# Patient Record
Sex: Female | Born: 1955 | Race: Black or African American | Hispanic: No | Smoking: Never smoker
Health system: Southern US, Community
[De-identification: ages and names within clinical notes are randomized; demographics above are authoritative.]

## PROBLEM LIST (undated history)

## (undated) DIAGNOSIS — G709 Myoneural disorder, unspecified: Secondary | ICD-10-CM

## (undated) DIAGNOSIS — M1990 Unspecified osteoarthritis, unspecified site: Secondary | ICD-10-CM

## (undated) DIAGNOSIS — B351 Tinea unguium: Secondary | ICD-10-CM

## (undated) DIAGNOSIS — M5126 Other intervertebral disc displacement, lumbar region: Secondary | ICD-10-CM

## (undated) DIAGNOSIS — J45909 Unspecified asthma, uncomplicated: Secondary | ICD-10-CM

## (undated) DIAGNOSIS — E785 Hyperlipidemia, unspecified: Secondary | ICD-10-CM

## (undated) DIAGNOSIS — Z973 Presence of spectacles and contact lenses: Secondary | ICD-10-CM

## (undated) DIAGNOSIS — I10 Essential (primary) hypertension: Secondary | ICD-10-CM

## (undated) DIAGNOSIS — N390 Urinary tract infection, site not specified: Secondary | ICD-10-CM

## (undated) DIAGNOSIS — R7303 Prediabetes: Secondary | ICD-10-CM

## (undated) DIAGNOSIS — K219 Gastro-esophageal reflux disease without esophagitis: Secondary | ICD-10-CM

## (undated) DIAGNOSIS — J069 Acute upper respiratory infection, unspecified: Secondary | ICD-10-CM

## (undated) HISTORY — DX: Other intervertebral disc displacement, lumbar region: M51.26

## (undated) HISTORY — DX: Unspecified osteoarthritis, unspecified site: M19.90

## (undated) HISTORY — DX: Urinary tract infection, site not specified: N39.0

## (undated) HISTORY — DX: Essential (primary) hypertension: I10

## (undated) HISTORY — PX: LUMBAR DISC SURGERY: SHX700

## (undated) HISTORY — DX: Presence of spectacles and contact lenses: Z97.3

## (undated) HISTORY — DX: Hyperlipidemia, unspecified: E78.5

## (undated) HISTORY — DX: Gastro-esophageal reflux disease without esophagitis: K21.9

## (undated) HISTORY — PX: COLONOSCOPY: SHX174

## (undated) HISTORY — DX: Tinea unguium: B35.1

## (undated) HISTORY — DX: Unspecified asthma, uncomplicated: J45.909

## (undated) HISTORY — DX: Myoneural disorder, unspecified: G70.9

## (undated) HISTORY — DX: Acute upper respiratory infection, unspecified: J06.9

## (undated) HISTORY — DX: Prediabetes: R73.03

---

## 2004-04-12 ENCOUNTER — Encounter: Admission: RE | Admit: 2004-04-12 | Discharge: 2004-04-12 | Payer: Self-pay | Admitting: Specialist

## 2005-09-12 ENCOUNTER — Ambulatory Visit: Payer: Self-pay | Admitting: Family Medicine

## 2005-09-26 ENCOUNTER — Ambulatory Visit (HOSPITAL_COMMUNITY): Admission: RE | Admit: 2005-09-26 | Discharge: 2005-09-26 | Payer: Self-pay | Admitting: Orthopaedic Surgery

## 2005-09-26 ENCOUNTER — Ambulatory Visit: Payer: Self-pay | Admitting: *Deleted

## 2005-09-30 ENCOUNTER — Encounter: Payer: Self-pay | Admitting: Family Medicine

## 2005-09-30 ENCOUNTER — Ambulatory Visit: Payer: Self-pay | Admitting: Family Medicine

## 2005-10-04 ENCOUNTER — Ambulatory Visit (HOSPITAL_COMMUNITY): Admission: RE | Admit: 2005-10-04 | Discharge: 2005-10-04 | Payer: Self-pay | Admitting: Family Medicine

## 2007-11-22 ENCOUNTER — Emergency Department (HOSPITAL_COMMUNITY): Admission: EM | Admit: 2007-11-22 | Discharge: 2007-11-22 | Payer: Self-pay | Admitting: Emergency Medicine

## 2007-12-18 ENCOUNTER — Encounter: Admission: RE | Admit: 2007-12-18 | Discharge: 2008-03-12 | Payer: Self-pay | Admitting: Internal Medicine

## 2008-01-13 ENCOUNTER — Encounter: Admission: RE | Admit: 2008-01-13 | Discharge: 2008-01-13 | Payer: Self-pay | Admitting: Occupational Medicine

## 2008-02-11 ENCOUNTER — Encounter
Admission: RE | Admit: 2008-02-11 | Discharge: 2008-02-11 | Payer: Self-pay | Admitting: Physical Medicine & Rehabilitation

## 2008-07-02 ENCOUNTER — Inpatient Hospital Stay (HOSPITAL_COMMUNITY): Admission: RE | Admit: 2008-07-02 | Discharge: 2008-07-06 | Payer: Self-pay | Admitting: Orthopedic Surgery

## 2010-09-27 LAB — BASIC METABOLIC PANEL
BUN: 10 mg/dL (ref 6–23)
CO2: 31 mEq/L (ref 19–32)
Calcium: 9.8 mg/dL (ref 8.4–10.5)
Chloride: 104 mEq/L (ref 96–112)
Creatinine, Ser: 0.75 mg/dL (ref 0.4–1.2)
GFR calc Af Amer: >60 mL/min (ref >60)
GFR calc non Af Amer: >60 mL/min (ref >60)
Glucose, Bld: 105 mg/dL — ABNORMAL HIGH (ref 70–99)
Potassium: 4.2 mEq/L (ref 3.5–5.1)
Sodium: 142 mEq/L (ref 135–145)

## 2010-09-27 LAB — HEMOGLOBIN AND HEMATOCRIT, BLOOD
HCT: 37.4 % (ref 36.0–46.0)
Hemoglobin: 12.4 g/dL (ref 12.0–15.0)

## 2010-10-26 NOTE — Op Note (Signed)
NAMEVENETA, SLITER                 ACCOUNT NO.:  [id number]   MEDICAL RECORD NO.:  [id number]          PATIENT TYPE:  AMB   LOCATION:  DAY                          FACILITY:  [facility]   PHYSICIAN:  Alvy Beal, MD    DATE OF BIRTH:  03/11/1956   DATE OF PROCEDURE:  DATE OF DISCHARGE:                               OPERATIVE REPORT   PREOPERATIVE DIAGNOSIS:  Lumbar spinal stenosis L3-4, 4-5.   POSTOPERATIVE DIAGNOSIS:  Lumbar spinal stenosis L3-4, 4-5.   OPERATIVE PROCEDURE:  Lumbar decompression L3-4, 4-5.   COMPLICATIONS:  None.   ESTIMATED BLOOD LOSS:  400 mL.   FIRST ASSISTANT:  Crissie Reese, PA.   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  Clinical and radiographic  analysis confirmed the diagnosis of spinal stenosis.  Attempts at  conservative management consisting of injection therapy and physical  therapy and narcotic medications have failed to alleviate her symptoms.  Because of the progression in her neurogenic claudication symptoms and  her overall pain, she elected to proceed with surgery.  All appropriate  risks, benefits and alternatives to surgery were discussed and consent  was obtained.   OPERATIVE NOTE:  The patient was brought to the operating room and  placed supine on the operating table.  After successful induction of  general anesthesia, endotracheal intubation, TEDs, SCDs and a Foley were  applied.  She was turned prone onto the Florence frame.  The arms were  placed overhead and all bony prominences were well-padded.   The spine was then prepped and draped in a sterile fashion.  Two needles  were placed in the lumbar spine and x-ray was taken to localize for  incision.  Midline incision was then made and sharp dissection was  carried out down to and through the deep fascia.  I then dissected  through the  deep fascia and using electrocautery I resected the  paraspinal muscles to expose the L3-L4 and L5 spinous processes.  Using  a Cobb elevator, I completed the subperiosteal dissection, visualized  the spinous process and lamina and facet complex.  Great care was taken  not to violate the facet complex.  Once I had the L3, L4, L5 spinous  processes and the lamina exposed, I then placed a Penfield-4 underneath  the L3 lamina and took a second x-ray, confirming I was at the  appropriate level.   Once this was confirmed, I then resected the L4 spinous process in its  entirety and the majority of the L3 and L5 spinous process.  I then used  a micro-curette to develop a plane underneath the L4 lamina and I did a  complete L4 laminectomy.  I resected the ligamentum flavum and bone to  expose the underlying thecal sac.  I then dissected up superiorly and  performed a partial L3 laminotomy bilaterally.  At this point I then  went into the lateral gutter, removing the thickened ligamentum flavum  and  bone spur until I could identify the L3 pedicle.  Once I identified  the L3 pedicle, I completed the lateral decompression so that I was at  the medial border of the pedicle.  I then proceeded inferiorly, exposing  the L3 nerve and performing a foraminotomy.  Once I had the L3 pedicle  exposed, I could clearly visualize the L3 nerve as it exited the L3  neural foramen.  Once this was decompressed, I continued inferiorly  decompressing the lateral recess until I identified the L4 pedicle.  Again I used the medial border as my most lateral extent of the  decompression.  I again identified the L4 nerve as it was exiting on the  inferior aspect of the pedicle and decompressed this area and performed  a generous foraminotomy.  At this point I went down until I could  palpate and visualize the L5 pedicle and also in a similar fashion  performed a foraminotomy.  At this point I could take a hockey-stick   (dural elevator) and freely pass it along the lateral gutter and out  each of the neural foramen.  At this point I went to the contralateral  side and performed a similar decompression on the contralateral side.   At this point I irrigated copiously with normal saline.  I placed a  Penfield-4 in the L5 neural foramen and a Penfield-4 in the L3 neural  foramen and took an x-ray to again confirm that I had an adequate  decompression and I had spanned the appropriate levels.  Once this was  confirmed, I irrigated copiously with normal saline.  I then checked one  last time in both lateral recesses and neural foramen bilaterally to  ensure that I had adequate bilateral foraminotomies and decompression  and central and lateral recess decompressions at the L3-4 level and L4-5  level.  At this point I used bipolar electrocautery and FloSeal to  obtain and maintain hemostasis.  I then placed a Hemovac drain and then  closed the deep fascia with interrupted #1 Vicryl sutures, superficial  with 2-0 Vicryl sutures, and a 3-0 Monocryl for the skin.  Steri-Strips,  dry dressing were applied, and the patient was extubated and transferred  to the PACU without incident.  At the end of the case all needle and  sponge counts were correct.  The patient tolerated the procedure well.  No intraoperative complications.      Alvy Beal, MD  Electronically Signed     DDB/MEDQ  D:  07/02/2008  T:  07/02/2008  Job:  [id number]

## 2010-10-29 NOTE — Discharge Summary (Signed)
[name], [name]                 ACCOUNT NO.:  [id number]   MEDICAL RECORD NO.:  [id number]          PATIENT TYPE:  INP   LOCATION:  1606                         FACILITY:  [facility]   PHYSICIAN:  Alvy Beal, MD    DATE OF BIRTH:  1956/01/28   DATE OF ADMISSION:  07/02/2008  DATE OF DISCHARGE:  07/06/2008                               DISCHARGE SUMMARY   ADMISSION DIAGNOSIS:  Lumbar spinal stenosis.   DISCHARGE DIAGNOSIS:  Lumbar spinal stenosis.   PROCEDURE:  This is an L3-4, L4-5 lumbar decompression.   BRIEF HISTORY:  Ms. [name] is a very pleasant 55 year old female who has  had some significant low back and leg pain.  Both MRIs confirmed the  diagnosis of lumbar spinal stenosis and unfortunately despite physical  therapy, injection therapy, and pain management, the patient did not get  any relief.  Therefore, the patient elected to undergo lumbar spinal  stenosis surgery.  Dr. Shon Baton did go over the risks with the patient,  which included infection, bleeding, nerve damage, death, stroke,  paralysis, failure to heal, need for further surgery, ongoing or worse  pain, loss of bowel and/or bladder control.  The patient understood the  risks of the procedure and consented.   HOSPITAL COURSE:  The patient's hospital course was approximately 4 days  in length.  Postoperatively day #1, the patient was doing well.  Her  back pain had improved.  She had 25 mL out of her Hemovac and her back  pain was well controlled.  She began working with Physical Therapy.  Postoperatively day #2, the patient had some mild pain complaints, which  slowed her therapy down.  However, she still continued to make progress.  Postoperatively day #4, the patient was doing very well.  She was  tolerating a regular diet, was ambulating with the use of a walker,  having regular bowel movements.  No leg pain.  Calves remained soft and  nontender.  Vital signs remained stable throughout her visit, and she  was  deemed stable to be discharged home with home health.   DISPOSITION:  The patient is discharged in stable condition to home with  [facility].   DISCHARGE MEDICATIONS:  The patient was discharged on Percocet 5/325, 1-  2 tabs p.o. q.4-6 h. p.r.n. pain.  She was also discharged on Robaxin  500 mg 1 tablet p.o. q.8 h. p.r.n. pain.  She was instructed to continue  her Atenolol/chlorothiazide 50/25.  She can continue to use the [employer]  stool softener and she was instructed to discontinue the use of her  hydrocodone.   DISCHARGE INSTRUCTIONS:  The patient is instructed to follow up with Dr.  Shon Baton in 2 weeks in the office for suture removal.  The patient was  given a pre-printed discharge instruction sheet that went over activity.  The patient is not to lift anything heavier than 6 pounds, which is  approximately a gallon of milk, she is to employ back precautions, no  bending, twisting, stooping, or squatting.  She is to walk as much as  possible.  She is allowed to  shower postoperatively day#3.  She is to keep the incision clean and  dry.  She is to have daily dressing changes everyday for 7 days.  The  patient was instructed to call the office for increased fevers, any  increased pain around the incision site, loss of bowel or bladder  function at [phone].      Crissie Reese, PA      Alvy Beal, MD  Electronically Signed    AC/MEDQ  D:  08/07/2008  T:  08/08/2008  Job:  [id number]

## 2012-12-17 ENCOUNTER — Other Ambulatory Visit: Payer: Self-pay | Admitting: Internal Medicine

## 2012-12-17 DIAGNOSIS — Z1231 Encounter for screening mammogram for malignant neoplasm of breast: Secondary | ICD-10-CM

## 2013-03-08 ENCOUNTER — Other Ambulatory Visit (HOSPITAL_COMMUNITY): Payer: Self-pay | Admitting: Internal Medicine

## 2013-04-09 ENCOUNTER — Other Ambulatory Visit (HOSPITAL_COMMUNITY): Payer: Self-pay | Admitting: Internal Medicine

## 2013-04-09 DIAGNOSIS — Z1231 Encounter for screening mammogram for malignant neoplasm of breast: Secondary | ICD-10-CM

## 2013-04-23 ENCOUNTER — Ambulatory Visit (HOSPITAL_COMMUNITY)
Admission: RE | Admit: 2013-04-23 | Discharge: 2013-04-23 | Disposition: A | Discharge status: Home / Self Care | Payer: Self-pay | Source: Ambulatory Visit | Attending: Internal Medicine | Admitting: Internal Medicine

## 2013-04-23 DIAGNOSIS — Z1231 Encounter for screening mammogram for malignant neoplasm of breast: Secondary | ICD-10-CM

## 2014-04-15 ENCOUNTER — Other Ambulatory Visit (HOSPITAL_COMMUNITY): Payer: Self-pay | Admitting: Internal Medicine

## 2014-04-15 DIAGNOSIS — Z1231 Encounter for screening mammogram for malignant neoplasm of breast: Secondary | ICD-10-CM

## 2014-04-24 ENCOUNTER — Ambulatory Visit (HOSPITAL_COMMUNITY)
Admission: RE | Admit: 2014-04-24 | Discharge: 2014-04-24 | Disposition: A | Discharge status: Home / Self Care | Payer: BC Managed Care – PPO | Source: Ambulatory Visit | Attending: Internal Medicine | Admitting: Internal Medicine

## 2014-04-24 DIAGNOSIS — Z1231 Encounter for screening mammogram for malignant neoplasm of breast: Secondary | ICD-10-CM | POA: Diagnosis not present

## 2014-08-06 ENCOUNTER — Encounter: Payer: Self-pay | Admitting: Internal Medicine

## 2014-08-29 ENCOUNTER — Ambulatory Visit (AMBULATORY_SURGERY_CENTER): Payer: Self-pay | Admitting: *Deleted

## 2014-08-29 VITALS — Ht 60.0 in | Wt 233.0 lb

## 2014-08-29 DIAGNOSIS — Z1211 Encounter for screening for malignant neoplasm of colon: Secondary | ICD-10-CM

## 2014-08-29 MED ORDER — MOVIPREP 100 G PO SOLR: 1.0000 | Freq: Once | ORAL | Status: DC

## 2014-08-29 NOTE — Progress Notes (Signed)
No egg or soy allergy No issues with past surgery No diet pills No home 02 use Pt emmi video to e mail

## 2014-09-12 ENCOUNTER — Encounter: Payer: Self-pay | Admitting: Internal Medicine

## 2014-09-12 ENCOUNTER — Ambulatory Visit (AMBULATORY_SURGERY_CENTER): Payer: BLUE CROSS/BLUE SHIELD | Admitting: Internal Medicine

## 2014-09-12 VITALS — BP 126/75 | HR 58 | Temp 98.4°F | Resp 23 | Ht 60.0 in | Wt 233.0 lb

## 2014-09-12 DIAGNOSIS — Z1211 Encounter for screening for malignant neoplasm of colon: Secondary | ICD-10-CM | POA: Diagnosis not present

## 2014-09-12 MED ORDER — SODIUM CHLORIDE 0.9 % IV SOLN: 500.0000 mL | INTRAVENOUS | Status: DC

## 2014-09-12 NOTE — Patient Instructions (Signed)
YOU HAD AN ENDOSCOPIC PROCEDURE TODAY AT THE [facility]:   Refer to the procedure report that was given to you for any specific questions about what was found during the examination.  If the procedure report does not answer your questions, please call your gastroenterologist to clarify.  If you requested that your care partner not be given the details of your procedure findings, then the procedure report has been included in a sealed envelope for you to review at your convenience later.  YOU SHOULD EXPECT: Some feelings of bloating in the abdomen. Passage of more gas than usual.  Walking can help get rid of the air that was put into your GI tract during the procedure and reduce the bloating. If you had a lower endoscopy (such as a colonoscopy or flexible sigmoidoscopy) you may notice spotting of blood in your stool or on the toilet paper. If you underwent a bowel prep for your procedure, you may not have a normal bowel movement for a few days.  Please Note:  You might notice some irritation and congestion in your nose or some drainage.  This is from the oxygen used during your procedure.  There is no need for concern and it should clear up in a day or so.  SYMPTOMS TO REPORT IMMEDIATELY:   Following lower endoscopy (colonoscopy or flexible sigmoidoscopy):  Excessive amounts of blood in the stool  Significant tenderness or worsening of abdominal pains  Swelling of the abdomen that is new, acute  Fever of 100F or higher    For urgent or emergent issues, a gastroenterologist can be reached at any hour by calling [phone].   DIET: Your first meal following the procedure should be a small meal and then it is ok to progress to your normal diet. Heavy or fried foods are harder to digest and may make you feel nauseous or bloated.  Likewise, meals heavy in dairy and vegetables can increase bloating.  Drink plenty of fluids but you should avoid alcoholic beverages for 24  hours.  ACTIVITY:  You should plan to take it easy for the rest of today and you should NOT DRIVE or use heavy machinery until tomorrow (because of the sedation medicines used during the test).    FOLLOW UP: Our staff will call the number listed on your records the next business day following your procedure to check on you and address any questions or concerns that you may have regarding the information given to you following your procedure. If we do not reach you, we will leave a message.  However, if you are feeling well and you are not experiencing any problems, there is no need to return our call.  We will assume that you have returned to your regular daily activities without incident.  If any biopsies were taken you will be contacted by phone or by letter within the next 1-3 weeks.  Please call us at [phone] if you have not heard about the biopsies in 3 weeks.    SIGNATURES/CONFIDENTIALITY: You and/or your care partner have signed paperwork which will be entered into your electronic medical record.  These signatures attest to the fact that that the information above on your After Visit Summary has been reviewed and is understood.  Full responsibility of the confidentiality of this discharge information lies with you and/or your care-partner.  Return 10 years for colonoscopy.

## 2014-09-12 NOTE — Progress Notes (Signed)
Report to PACU, RN, vss, BBS= Clear.  

## 2014-09-12 NOTE — Op Note (Signed)
[facility] [address] [place], [id number]   COLONOSCOPY PROCEDURE REPORT  PATIENT: Connie MapleJensen, [name]  MR#: [id number] BIRTHDATE: [birth year] , 58  yrs. old GENDER: female ENDOSCOPIST: Hart Carwinora M Ingvald Theisen, MD REFERRED XB:JYNWGBY:Edwin Concepcion ElkAvbuere, M.D. PROCEDURE DATE:  09/12/2014 PROCEDURE:   Colonoscopy, screening First Screening Colonoscopy - Avg.  risk and is 50 yrs.  old or older Yes.  Prior Negative Screening - Now for repeat screening. N/A  History of Adenoma - Now for follow-up colonoscopy & has been > or = to 3 yrs.  N/A ASA CLASS:   Class II INDICATIONS:Screening for colonic neoplasia and Colorectal Neoplasm Risk Assessment for this procedure is average risk. MEDICATIONS: Monitored anesthesia care and Propofol 200 mg IV  DESCRIPTION OF PROCEDURE:   After the risks benefits and alternatives of the procedure were thoroughly explained, informed consent was obtained.  The digital rectal exam revealed no abnormalities of the rectum.   The LB PFC-H190 [id number]  endoscope was introduced through the anus and advanced to the cecum, which was identified by both the appendix and ileocecal valve. No adverse events experienced.   The quality of the prep was fair.  (MoviPrep was used)  The instrument was then slowly withdrawn as the colon was fully examined.      COLON FINDINGS: A normal appearing cecum, ileocecal valve, and appendiceal orifice were identified.  The ascending, transverse, descending, sigmoid colon, and rectum appeared unremarkable. Retroflexed views revealed no abnormalities. The time to cecum = 5.44 Withdrawal time = 6.01   The scope was withdrawn and the procedure completed. COMPLICATIONS: There were no immediate complications.  ENDOSCOPIC IMPRESSION: Normal colonoscopy  RECOMMENDATIONS: High fiber diet Recall colonoscopy in 10 years  eSigned:  Hart Carwinora M Kameshia Madruga, MD 09/12/2014 10:55 AM   cc:

## 2014-09-15 ENCOUNTER — Telehealth: Payer: Self-pay | Admitting: *Deleted

## 2014-09-15 NOTE — Telephone Encounter (Signed)
  Follow up Call-  Call back number 09/12/2014  Post procedure Call Back phone  # [phone]  Permission to leave phone message Yes     Patient questions:  Do you have a fever, pain , or abdominal swelling? No. Pain Score  0 *  Have you tolerated food without any problems? Yes.    Have you been able to return to your normal activities? Yes.    Do you have any questions about your discharge instructions: Diet   No. Medications  No. Follow up visit  No.  Do you have questions or concerns about your Care? No.  Actions: * If pain score is 4 or above: No action needed, pain <4.

## 2015-10-14 ENCOUNTER — Other Ambulatory Visit: Payer: Self-pay | Admitting: Internal Medicine

## 2015-10-14 ENCOUNTER — Other Ambulatory Visit: Payer: Self-pay

## 2015-10-14 DIAGNOSIS — Z1231 Encounter for screening mammogram for malignant neoplasm of breast: Secondary | ICD-10-CM

## 2015-10-14 DIAGNOSIS — E2839 Other primary ovarian failure: Secondary | ICD-10-CM

## 2015-10-30 ENCOUNTER — Ambulatory Visit
Admission: RE | Admit: 2015-10-30 | Discharge: 2015-10-30 | Disposition: A | Discharge status: Home / Self Care | Payer: Medicaid Other | Source: Ambulatory Visit | Attending: Internal Medicine | Admitting: Internal Medicine

## 2015-10-30 ENCOUNTER — Ambulatory Visit
Admission: RE | Admit: 2015-10-30 | Discharge: 2015-10-30 | Disposition: A | Discharge status: Home / Self Care | Payer: Medicaid Other | Source: Ambulatory Visit

## 2015-10-30 DIAGNOSIS — Z1231 Encounter for screening mammogram for malignant neoplasm of breast: Secondary | ICD-10-CM

## 2015-10-30 DIAGNOSIS — E2839 Other primary ovarian failure: Secondary | ICD-10-CM

## 2016-10-05 ENCOUNTER — Other Ambulatory Visit: Payer: Self-pay | Admitting: Internal Medicine

## 2016-10-05 DIAGNOSIS — Z1231 Encounter for screening mammogram for malignant neoplasm of breast: Secondary | ICD-10-CM

## 2016-10-31 ENCOUNTER — Ambulatory Visit: Payer: Medicaid Other

## 2016-12-05 ENCOUNTER — Ambulatory Visit
Admission: RE | Admit: 2016-12-05 | Discharge: 2016-12-05 | Disposition: A | Discharge status: Home / Self Care | Payer: Medicaid Other | Source: Ambulatory Visit | Attending: Internal Medicine | Admitting: Internal Medicine

## 2016-12-05 DIAGNOSIS — Z1231 Encounter for screening mammogram for malignant neoplasm of breast: Secondary | ICD-10-CM

## 2017-01-24 ENCOUNTER — Other Ambulatory Visit: Payer: Self-pay | Admitting: Specialist

## 2017-01-24 DIAGNOSIS — R490 Dysphonia: Secondary | ICD-10-CM

## 2017-01-24 DIAGNOSIS — M5481 Occipital neuralgia: Secondary | ICD-10-CM

## 2017-01-24 DIAGNOSIS — G3184 Mild cognitive impairment, so stated: Secondary | ICD-10-CM

## 2017-01-24 DIAGNOSIS — R251 Tremor, unspecified: Secondary | ICD-10-CM

## 2017-02-05 ENCOUNTER — Other Ambulatory Visit: Payer: Medicaid Other

## 2017-02-10 ENCOUNTER — Other Ambulatory Visit: Payer: Self-pay

## 2017-02-10 DIAGNOSIS — R609 Edema, unspecified: Secondary | ICD-10-CM

## 2017-03-22 ENCOUNTER — Encounter (HOSPITAL_COMMUNITY): Payer: Self-pay

## 2017-03-22 ENCOUNTER — Encounter: Payer: Self-pay | Admitting: Vascular Surgery

## 2017-03-30 ENCOUNTER — Encounter: Payer: Self-pay | Admitting: Vascular Surgery

## 2017-03-30 ENCOUNTER — Ambulatory Visit (HOSPITAL_COMMUNITY)
Admission: RE | Admit: 2017-03-30 | Discharge: 2017-03-30 | Disposition: A | Discharge status: Home / Self Care | Payer: Medicare Other | Source: Ambulatory Visit | Attending: Vascular Surgery | Admitting: Vascular Surgery

## 2017-03-30 ENCOUNTER — Ambulatory Visit (INDEPENDENT_AMBULATORY_CARE_PROVIDER_SITE_OTHER): Payer: Medicare Other | Admitting: Vascular Surgery

## 2017-03-30 VITALS — BP 139/91 | HR 73 | Temp 99.4°F | Resp 16 | Ht 66.0 in | Wt 213.0 lb

## 2017-03-30 DIAGNOSIS — R609 Edema, unspecified: Secondary | ICD-10-CM

## 2017-03-30 DIAGNOSIS — M7989 Other specified soft tissue disorders: Secondary | ICD-10-CM | POA: Diagnosis not present

## 2017-03-30 DIAGNOSIS — I872 Venous insufficiency (chronic) (peripheral): Secondary | ICD-10-CM | POA: Insufficient documentation

## 2017-03-30 NOTE — Progress Notes (Signed)
HISTORY AND PHYSICAL     CC:  Leg pain and swelling bilaterally Requesting Provider:  Fleet Contras, MD  HPI: This is a 61 y.o. female who presents today for evaluation for bilateral leg swelling and pain.  She states that the right is worse than the left.  She denies ever having a DVT that she knows of.  She states that when the swelling is really bad, she does wear compression stockings that were purchased at [employer].  She states that if she sits for a long period of time or if she stands for a long period of time, her sx worsen.  She states that she now will sit for 20 minutes and then stand for 20 minutes. She states that her legs hurt when she walks, but hurts more with prolonged standing.  She states that if she squats down, she has a lot of difficulty getting up and states that her legs are stiff.   She does have a hx of lumbar herniated disc with surgery.  She states that her right leg is worse than her left.  She is on a CCB and ACEI/diuretic for blood pressure control.    Other chronic medical problems include chronic back pain, hypertension, hyperlipidemia which are also currently stable  She moved here from [place] in 2000.  She has never smoked and does not drink alcohol.  Her mother had heart disease before the age of 61.  Past Medical History:  Diagnosis Date  . Arthritis   . Borderline diabetes   . GERD (gastroesophageal reflux disease)   . Hyperlipidemia   . Hypertension   . Lumbar herniated disc   . Neuromuscular disorder (HCC)    Fibromyalgia  . Onychomycosis   . UTI (lower urinary tract infection)   . Wears glasses     Past Surgical History:  Procedure Laterality Date  . LUMBAR DISC SURGERY      Allergies  Allergen Reactions  . Penicillins Nausea And Vomiting    tremors    Current Outpatient Prescriptions  Medication Sig Dispense Refill  . amLODipine (NORVASC) 5 MG tablet Take 5 mg by mouth daily.    . ciprofloxacin (CIPRO) 250 MG tablet Take 250  mg by mouth 2 (two) times daily.    . ergocalciferol (VITAMIN D2) 50000 UNITS capsule Take 50,000 Units by mouth once a week.    . gabapentin (NEURONTIN) 100 MG capsule Take 100 mg by mouth 3 (three) times daily.    Marland Kitchen lisinopril-hydrochlorothiazide (PRINZIDE,ZESTORETIC) 20-25 MG per tablet Take 1 tablet by mouth daily.    . Luliconazole 1 % CREA Apply 1 application topically 2 (two) times daily before a meal.    . OVER THE COUNTER MEDICATION Take 1 capsule by mouth 2 (two) times daily before a meal. Omega XL- for joints    . PRESCRIPTION MEDICATION Apply 1 application topically daily. Jublia 10% to toe fungus daily    . terbinafine (LAMISIL) 250 MG tablet Take 250 mg by mouth daily.     No current facility-administered medications for this visit.     Family History  Problem Relation Age of Onset  . Glaucoma Mother   . Colon cancer Neg Hx     Social History   Social History  . Marital status: Widowed    Spouse name: N/A  . Number of children: N/A  . Years of education: N/A   Occupational History  . Not on file.   Social History Main Topics  . Smoking status: Never  Smoker  . Smokeless tobacco: Never Used  . Alcohol use No     Comment: occ glass of wine  . Drug use: No  . Sexual activity: Not on file   Other Topics Concern  . Not on file   Social History Narrative  . No narrative on file     REVIEW OF SYSTEMS:   [X]  denotes positive finding, [ ]  denotes negative finding Cardiac  Comments:  Chest pain or chest pressure:    Shortness of breath upon exertion: x   Short of breath when lying flat: x   Irregular heart rhythm: x       Vascular    Pain in calf, thigh, or hip brought on by ambulation: x   Pain in feet at night that wakes you up from your sleep:  x   Blood clot in your veins:    Leg swelling:  x       Pulmonary    Oxygen at home:    Productive cough:     Wheezing:         Neurologic    Sudden weakness in arms or legs:  x   Sudden numbness in arms  or legs:  x   Sudden onset of difficulty speaking or slurred speech:    Temporary loss of vision in one eye:     Problems with dizziness:  x       Gastrointestinal    Blood in stool:     Vomited blood:         Genitourinary    Burning when urinating:     Blood in urine:        Psychiatric    Major depression:         Hematologic    Bleeding problems:    Problems with blood clotting too easily:        Skin    Rashes or ulcers: x       Constitutional    Fever or chills: x     PHYSICAL EXAMINATION:  Vitals:   03/30/17 1513  BP: (!) 139/91  Pulse: 73  Resp: 16  Temp: 99.4 F (37.4 C)  SpO2: 100%   Vitals:   03/30/17 1513  Weight: 213 lb (96.6 kg)  Height: 5\' 6"  (1.676 m)   Body mass index is 34.38 kg/m.  General:  WDWN in NAD; vital signs documented above Gait: Not observed HENT: WNL, normocephalic Pulmonary: normal non-labored breathing , without Rales, rhonchi,  wheezing Cardiac: regular HR, without  Murmurs without carotid bruits Abdomen: soft, NT, no masses Skin: without rashes Vascular Exam/Pulses:  Right Left  Radial 2+ (normal) 2+ (normal)  Femoral 2+ (normal) 2+ (normal)  Popliteal Unable to palpate  Unable to palpate   DP 2+ (weak) 2+ (normal)  PT Unable to palpate  Unable to palpate    Extremities:  BLE swelling-non pitting; darkening color changes to feet bilaterally; no wounds present Musculoskeletal: no muscle wasting or atrophy  Neurologic: A&O X 3;  No focal weakness or paresthesias are detected Psychiatric:  The pt has Normal affect.   Non-Invasive Vascular Imaging:   Lower Extremity Venous Duplex 03/30/17: 1.  No evidence of DVT or superficial thrombosis  2.  Venous incompetence of >54700ms noted in the right GSV in the proximal calf and CFV 3.  Venous incompetence of >56000ms noted in the left saphenofemoral junction, proximal thigh and CFV  GSV Measurements: Right:  0.16cm-1.02cm Left:  0.17cm-0.68cm  SSV  Measurements: Right:  0.2cm-0.36cm Left:  0.17cm   Pt meds includes: Statin:  No. Beta Blocker:  No. Aspirin:  No. ACEI:  Yes.   ARB:  No. CCB use:  Yes Other Antiplatelet/Anticoagulant:  No   ASSESSMENT/PLAN:: 61 y.o. female with bilateral leg pain and swelling (right>left)   -pt does have some mild venous reflux in bilateral lower extremities, but is not a candidate for laser ablation.  Dr. Darrick Penna has recommended that she wear compression stockings and continue to exercise and walk daily.  She is given an [employer] Therapy brochure for compression.   -we will see her back as needed   Doreatha Massed, PA-C [facility] [phone]  Clinic MD:  Pt seen and examined with Dr. Darrick Penna  History and exam findings as above. Patient does have some evidence of superficial venous reflux. However her vein diameter is not large enough to consider laser ablation. She also has a component of deep vein reflux as well. She may also have some element of venous hypertension secondary to obesity.  I recommended that she get 20-30 mm knee-high compression stockings for symptomatically relief. She will follow-up with [place] on as-needed basis.  Fabienne Bruns, MD [facility] [place] Office: [phone] Pager: [phone]

## 2017-04-04 DIAGNOSIS — I868 Varicose veins of other specified sites: Secondary | ICD-10-CM

## 2017-11-20 ENCOUNTER — Other Ambulatory Visit: Payer: Self-pay | Admitting: Internal Medicine

## 2017-11-20 DIAGNOSIS — Z1231 Encounter for screening mammogram for malignant neoplasm of breast: Secondary | ICD-10-CM

## 2017-12-11 ENCOUNTER — Other Ambulatory Visit: Payer: Self-pay | Admitting: Internal Medicine

## 2017-12-11 ENCOUNTER — Ambulatory Visit
Admission: RE | Admit: 2017-12-11 | Discharge: 2017-12-11 | Disposition: A | Discharge status: Home / Self Care | Payer: Medicare Other | Source: Ambulatory Visit | Attending: Internal Medicine | Admitting: Internal Medicine

## 2017-12-11 DIAGNOSIS — Z1231 Encounter for screening mammogram for malignant neoplasm of breast: Secondary | ICD-10-CM

## 2017-12-11 DIAGNOSIS — E2839 Other primary ovarian failure: Secondary | ICD-10-CM

## 2017-12-12 ENCOUNTER — Ambulatory Visit
Admission: RE | Admit: 2017-12-12 | Discharge: 2017-12-12 | Disposition: A | Discharge status: Home / Self Care | Payer: Medicare Other | Source: Ambulatory Visit | Attending: Internal Medicine | Admitting: Internal Medicine

## 2017-12-12 DIAGNOSIS — E2839 Other primary ovarian failure: Secondary | ICD-10-CM

## 2018-07-14 ENCOUNTER — Encounter (HOSPITAL_COMMUNITY): Payer: Self-pay

## 2018-07-14 ENCOUNTER — Ambulatory Visit (HOSPITAL_COMMUNITY)
Admission: EM | Admit: 2018-07-14 | Discharge: 2018-07-14 | Disposition: A | Discharge status: Home / Self Care | Payer: Medicare Other | Attending: Family Medicine | Admitting: Family Medicine

## 2018-07-14 ENCOUNTER — Other Ambulatory Visit: Payer: Self-pay

## 2018-07-14 DIAGNOSIS — R6889 Other general symptoms and signs: Secondary | ICD-10-CM | POA: Diagnosis not present

## 2018-07-14 MED ORDER — FLUTICASONE PROPIONATE 50 MCG/ACT NA SUSP: 2.0000 | Freq: Every day | NASAL | 0 refills | Status: AC

## 2018-07-14 MED ORDER — ACETAMINOPHEN 325 MG PO TABS
ORAL_TABLET | ORAL | Status: AC
  Filled 2018-07-14: qty 2

## 2018-07-14 MED ORDER — CETIRIZINE HCL 10 MG PO CHEW: 10.0000 mg | CHEWABLE_TABLET | Freq: Every day | ORAL | 0 refills | Status: DC

## 2018-07-14 MED ORDER — ACETAMINOPHEN 325 MG PO TABS
650.0000 mg | ORAL_TABLET | Freq: Once | ORAL | Status: AC
  Administered 2018-07-14: 650 mg via ORAL

## 2018-07-14 MED ORDER — BENZONATATE 100 MG PO CAPS: 100.0000 mg | ORAL_CAPSULE | Freq: Three times a day (TID) | ORAL | 0 refills | Status: DC

## 2018-07-14 NOTE — ED Triage Notes (Signed)
Pt cc fever , chills, SOB and coughing . X 3 days

## 2018-07-14 NOTE — Discharge Instructions (Signed)
Get plenty of rest and push fluids.  Drink at least half your body weight in ounces.  Supplement with pedialyte or OTC oral rehydration solution as needed.  Tessalon Perles prescribed for cough Zyrtec-D prescribed for nasal congestion, runny nose, and/or sore throat Flonase prescribed for nasal congestion and runny nose Use medications daily for symptom relief Use OTC medications like ibuprofen or tylenol as needed fever or pain Call PCP first thing Monday for further evaluation and management of symptoms and whether to begin taking medications for upcoming trip to [place]. Return or go to ER if you have any new or worsening symptoms fever, chills, nausea, vomiting, chest pain, cough, shortness of breath, wheezing, abdominal pain, changes in bowel or bladder habits, etc..Marland Kitchen

## 2018-07-14 NOTE — ED Provider Notes (Signed)
[facility]   [id number] 07/14/18 Arrival Time: 1008   CC: URI symptoms   SUBJECTIVE: History from: patient.  [name] is a 63 y.o. female who presents with abrupt onset of nasal congestion, runny nose, sore throat, and cough x 3 days.  Denies sick exposure or precipitating event.  States she received immunizations on 08/11/2018 for upcoming trip to [place], and that's when symptoms occurred.  Has NOT tried OTC medications.  Reports previous symptoms in the past.  Complains of associated fever, chills, fatigue, and nausea.  Denies sinus pain, SOB, wheezing, chest pain, nausea, changes in bowel or bladder habits.    Received flu shot this year: yes.  ROS: As per HPI.  Past Medical History:  Diagnosis Date  . Arthritis   . Borderline diabetes   . GERD (gastroesophageal reflux disease)   . Hyperlipidemia   . Hypertension   . Lumbar herniated disc   . Neuromuscular disorder (HCC)    Fibromyalgia  . Onychomycosis   . UTI (lower urinary tract infection)   . Wears glasses    Past Surgical History:  Procedure Laterality Date  . LUMBAR DISC SURGERY     Allergies  Allergen Reactions  . Penicillins Nausea And Vomiting    tremors   No current facility-administered medications on file prior to encounter.    Current Outpatient Medications on File Prior to Encounter  Medication Sig Dispense Refill  . amLODipine (NORVASC) 5 MG tablet Take 5 mg by mouth daily.    . ciprofloxacin (CIPRO) 250 MG tablet Take 250 mg by mouth 2 (two) times daily.    . ergocalciferol (VITAMIN D2) 50000 UNITS capsule Take 50,000 Units by mouth once a week.    . gabapentin (NEURONTIN) 100 MG capsule Take 100 mg by mouth 3 (three) times daily.    Marland Kitchen lisinopril-hydrochlorothiazide (PRINZIDE,ZESTORETIC) 20-25 MG per tablet Take 1 tablet by mouth daily.    . Luliconazole 1 % CREA Apply 1 application topically 2 (two) times daily before a meal.    . OVER THE COUNTER MEDICATION Take 1 capsule by mouth 2  (two) times daily before a meal. Omega XL- for joints    . PRESCRIPTION MEDICATION Apply 1 application topically daily. Jublia 10% to toe fungus daily    . terbinafine (LAMISIL) 250 MG tablet Take 250 mg by mouth daily.     Social History   Socioeconomic History  . Marital status: Widowed    Spouse name: Not on file  . Number of children: Not on file  . Years of education: Not on file  . Highest education level: Not on file  Occupational History  . Not on file  Social Needs  . Financial resource strain: Not on file  . Food insecurity:    Worry: Not on file    Inability: Not on file  . Transportation needs:    Medical: Not on file    Non-medical: Not on file  Tobacco Use  . Smoking status: Never Smoker  . Smokeless tobacco: Never Used  Substance and Sexual Activity  . Alcohol use: No    Alcohol/week: 0.0 standard drinks    Comment: occ glass of wine  . Drug use: No  . Sexual activity: Not on file  Lifestyle  . Physical activity:    Days per week: Not on file    Minutes per session: Not on file  . Stress: Not on file  Relationships  . Social connections:    Talks on phone: Not on  file    Gets together: Not on file    Attends religious service: Not on file    Active member of club or organization: Not on file    Attends meetings of clubs or organizations: Not on file    Relationship status: Not on file  . Intimate partner violence:    Fear of current or ex partner: Not on file    Emotionally abused: Not on file    Physically abused: Not on file    Forced sexual activity: Not on file  Other Topics Concern  . Not on file  Social History Narrative  . Not on file   Family History  Problem Relation Age of Onset  . Glaucoma Mother   . Colon cancer Neg Hx   . Breast cancer Neg Hx     OBJECTIVE:  Vitals:   07/14/18 1037 07/14/18 1039  BP:  140/69  Pulse:  (!) 117  Resp:  18  Temp:  (!) 102.9 F (39.4 C)  TempSrc:  Oral  SpO2:  100%  Weight: 220 lb (99.8  kg)      General appearance: alert; appears fatigued, but nontoxic; speaking in full sentences and tolerating own secretions HEENT: NCAT; Ears: EACs clear, TMs pearly gray; Eyes: PERRL.  EOM grossly intact. Nose: nares patent with mild rhinorrhea, turbinates swollen and erythematous; Throat: oropharynx clear, tonsils non erythematous or enlarged, uvula midline  Neck: supple without LAD Lungs: unlabored respirations, symmetrical air entry; cough: mild; no respiratory distress; CTAB Heart: regular rate and rhythm.  Radial pulses 2+ symmetrical bilaterally Skin: warm and dry Psychological: alert and cooperative; normal mood and affect  ASSESSMENT & PLAN:  1. Flu-like symptoms     Meds ordered this encounter  Medications  . acetaminophen (TYLENOL) tablet 650 mg  . cetirizine (ZYRTEC) 10 MG chewable tablet    Sig: Chew 1 tablet (10 mg total) by mouth daily.    Dispense:  20 tablet    Refill:  0    Order Specific Question:   Supervising Provider    Answer:   Eustace MooreNELSON, YVONNE SUE [[id number]]  . fluticasone (FLONASE) 50 MCG/ACT nasal spray    Sig: Place 2 sprays into both nostrils daily.    Dispense:  16 g    Refill:  0    Order Specific Question:   Supervising Provider    Answer:   Eustace MooreNELSON, YVONNE SUE [[id number]]  . benzonatate (TESSALON) 100 MG capsule    Sig: Take 1 capsule (100 mg total) by mouth every 8 (eight) hours.    Dispense:  21 capsule    Refill:  0    Order Specific Question:   Supervising Provider    Answer:   Eustace MooreELSON, YVONNE SUE [[id number]]    Get plenty of rest and push fluids.  Drink at least half your body weight in ounces.  Supplement with pedialyte or OTC oral rehydration solution as needed.  Tessalon Perles prescribed for cough Zyrtec-D prescribed for nasal congestion, runny nose, and/or sore throat Flonase prescribed for nasal congestion and runny nose Use medications daily for symptom relief Use OTC medications like ibuprofen or tylenol as needed fever or pain Call  PCP first thing Monday for further evaluation and management of symptoms and whether to begin taking medications for upcoming trip to [place]. Return or go to ER if you have any new or worsening symptoms fever, chills, nausea, vomiting, chest pain, cough, shortness of breath, wheezing, abdominal pain, changes in bowel or bladder habits, etc...  Reviewed  expectations re: course of current medical issues. Questions answered. Outlined signs and symptoms indicating need for more acute intervention. Patient verbalized understanding. After Visit Summary given.         Rennis HardingWurst, Jabreel Chimento, PA-C 07/14/18 1425

## 2019-02-13 ENCOUNTER — Other Ambulatory Visit: Payer: Self-pay | Admitting: Internal Medicine

## 2019-02-13 DIAGNOSIS — Z1231 Encounter for screening mammogram for malignant neoplasm of breast: Secondary | ICD-10-CM

## 2019-03-29 ENCOUNTER — Other Ambulatory Visit: Payer: Self-pay

## 2019-03-29 ENCOUNTER — Ambulatory Visit
Admission: RE | Admit: 2019-03-29 | Discharge: 2019-03-29 | Disposition: A | Discharge status: Home / Self Care | Payer: Medicare Other | Source: Ambulatory Visit | Attending: Internal Medicine | Admitting: Internal Medicine

## 2019-03-29 DIAGNOSIS — Z1231 Encounter for screening mammogram for malignant neoplasm of breast: Secondary | ICD-10-CM

## 2019-06-14 HISTORY — PX: BILATERAL CARPAL TUNNEL RELEASE: SHX6508

## 2020-02-21 ENCOUNTER — Other Ambulatory Visit: Payer: Self-pay | Admitting: Internal Medicine

## 2020-02-21 DIAGNOSIS — Z1231 Encounter for screening mammogram for malignant neoplasm of breast: Secondary | ICD-10-CM

## 2020-03-30 ENCOUNTER — Ambulatory Visit
Admission: RE | Admit: 2020-03-30 | Discharge: 2020-03-30 | Disposition: A | Discharge status: Home / Self Care | Payer: Medicare Other | Source: Ambulatory Visit | Attending: Internal Medicine | Admitting: Internal Medicine

## 2020-03-30 ENCOUNTER — Other Ambulatory Visit: Payer: Self-pay

## 2020-03-30 DIAGNOSIS — Z1231 Encounter for screening mammogram for malignant neoplasm of breast: Secondary | ICD-10-CM

## 2020-06-30 ENCOUNTER — Other Ambulatory Visit: Payer: Self-pay | Admitting: Internal Medicine

## 2020-06-30 DIAGNOSIS — E2839 Other primary ovarian failure: Secondary | ICD-10-CM

## 2020-10-20 ENCOUNTER — Other Ambulatory Visit: Payer: Medicare Other

## 2020-10-21 ENCOUNTER — Ambulatory Visit
Admission: RE | Admit: 2020-10-21 | Discharge: 2020-10-21 | Disposition: A | Discharge status: Home / Self Care | Payer: Medicare Other | Source: Ambulatory Visit | Attending: Internal Medicine | Admitting: Internal Medicine

## 2020-10-21 ENCOUNTER — Other Ambulatory Visit: Payer: Self-pay

## 2020-10-21 DIAGNOSIS — E2839 Other primary ovarian failure: Secondary | ICD-10-CM

## 2020-10-24 IMAGING — MG DIGITAL SCREENING BILAT W/ TOMO W/ CAD
6 of 10 series · 6 of 30 positions shown · non-contrast
Comparison: Previous exam(s).

CLINICAL DATA: Screening.

EXAM:
DIGITAL SCREENING BILATERAL MAMMOGRAM WITH TOMO AND CAD

[R CC synth-2D (1 of 2)]
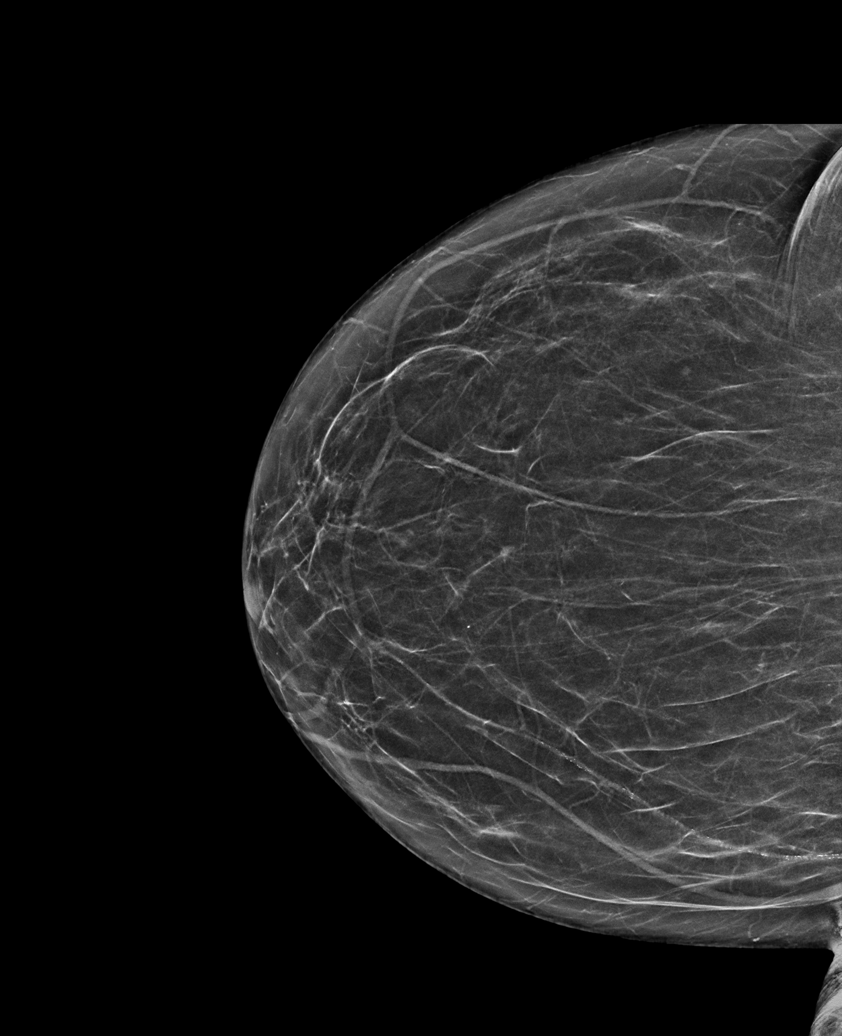

[L CC synth-2D]
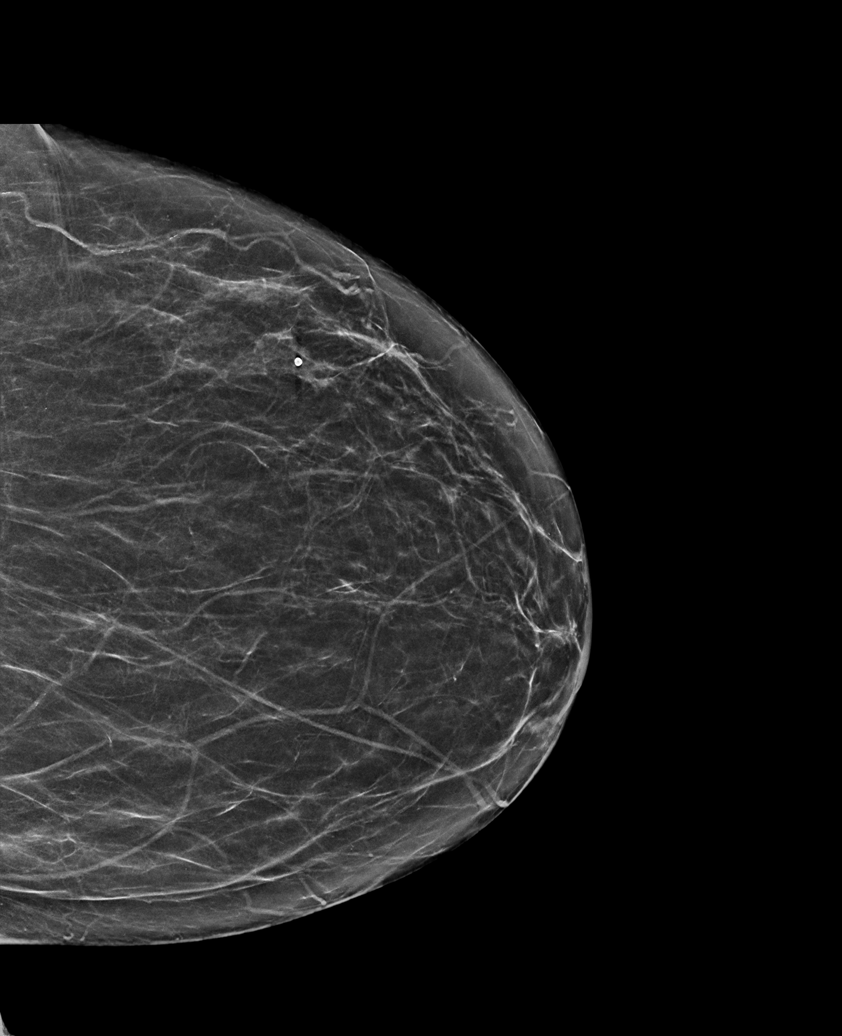

[R CC synth-2D (2 of 2)]
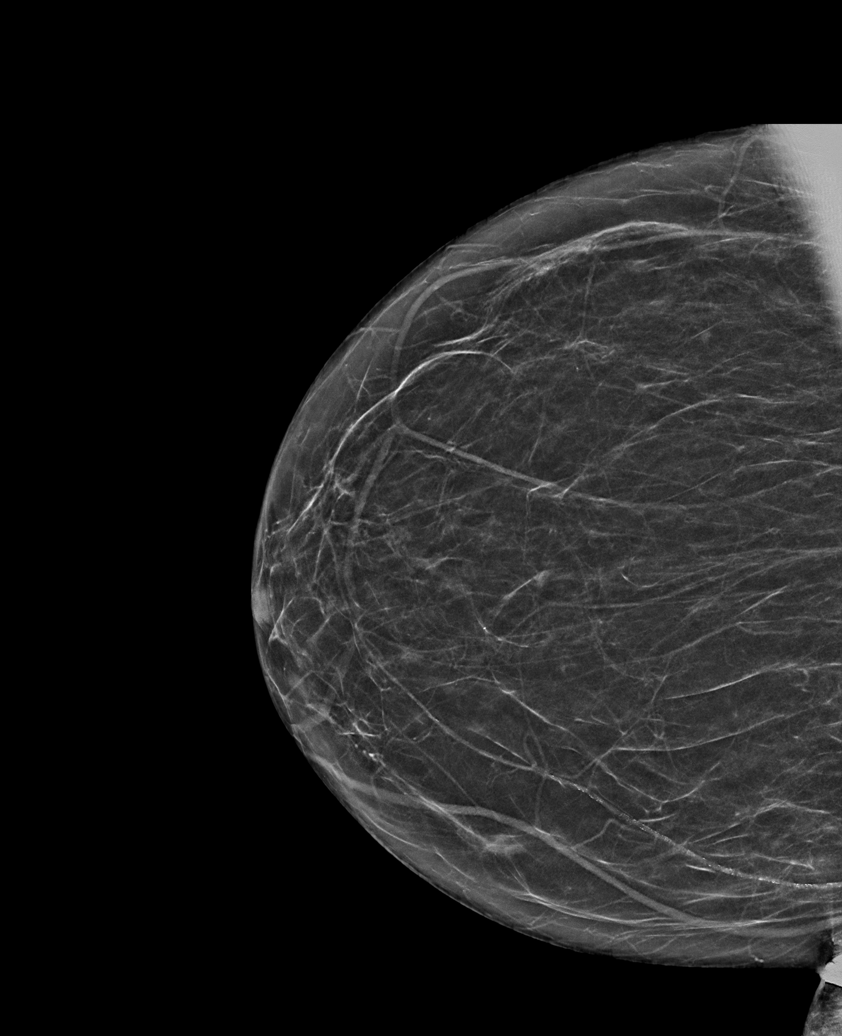

[R MLO synth-2D]
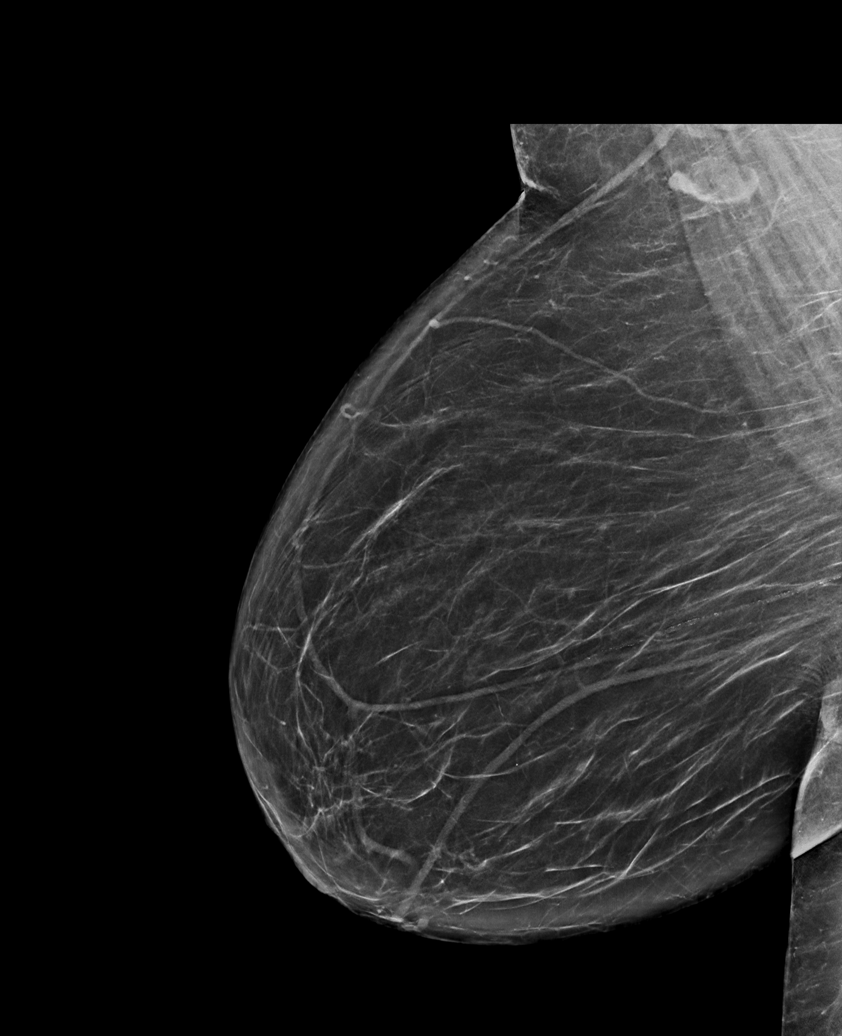

[L MLO synth-2D]
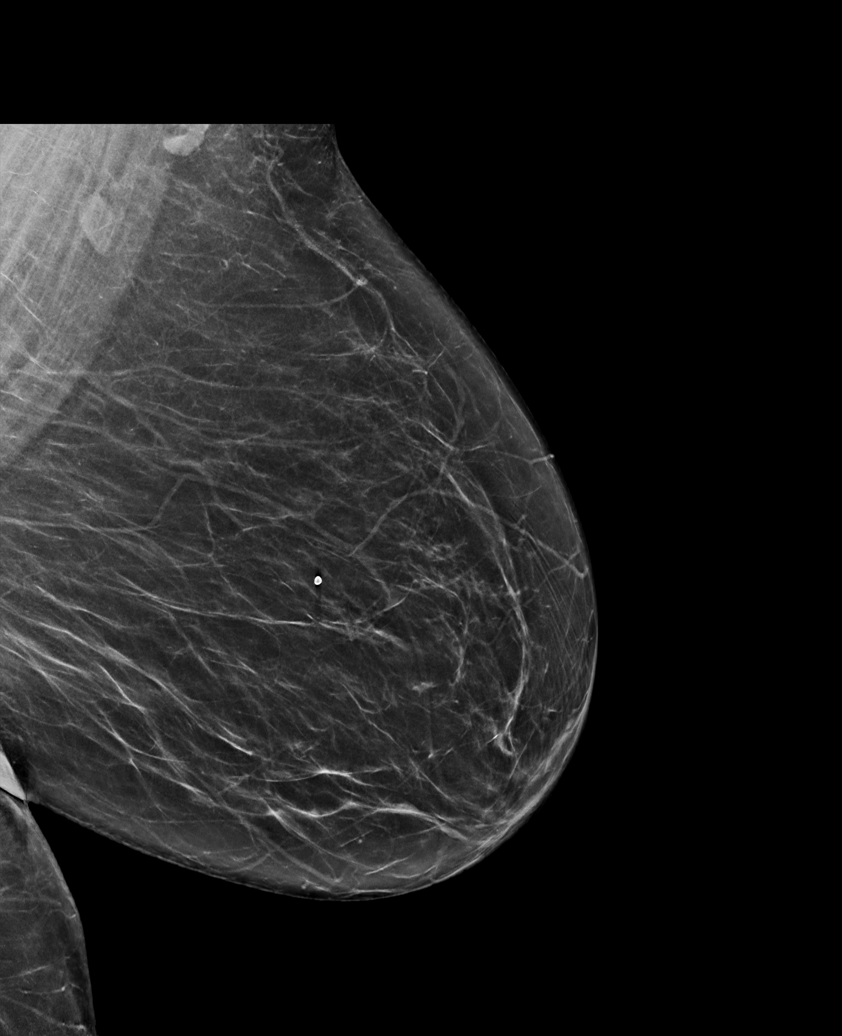

[R MLO tomo · tomo slice 36/71.0]
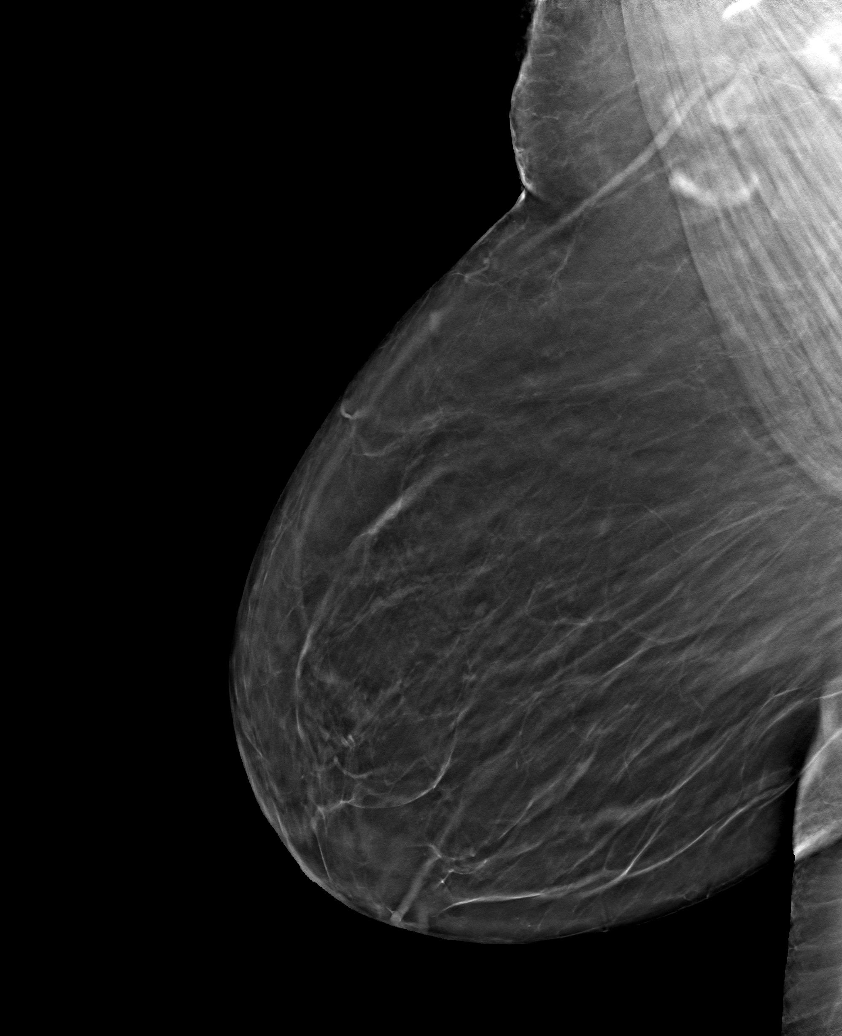

[6 of 30 positions shown; findings below may reference images not displayed]

ACR Breast Density Category b: There are scattered areas of
fibroglandular density.
FINDINGS: There are no findings suspicious for malignancy. Images were
processed with CAD.
IMPRESSION: No mammographic evidence of malignancy. A result letter of this
screening mammogram will be mailed directly to the patient.

RECOMMENDATION:
Screening mammogram in one year. (Code:[id number])

BI-RADS CATEGORY  1: Negative.

## 2021-02-24 ENCOUNTER — Other Ambulatory Visit: Payer: Self-pay | Admitting: Internal Medicine

## 2021-02-24 DIAGNOSIS — Z1231 Encounter for screening mammogram for malignant neoplasm of breast: Secondary | ICD-10-CM

## 2021-03-31 ENCOUNTER — Ambulatory Visit
Admission: RE | Admit: 2021-03-31 | Discharge: 2021-03-31 | Disposition: A | Discharge status: Home / Self Care | Payer: Medicare Other | Source: Ambulatory Visit | Attending: Internal Medicine | Admitting: Internal Medicine

## 2021-03-31 ENCOUNTER — Other Ambulatory Visit: Payer: Self-pay

## 2021-03-31 DIAGNOSIS — Z1231 Encounter for screening mammogram for malignant neoplasm of breast: Secondary | ICD-10-CM

## 2021-08-09 ENCOUNTER — Other Ambulatory Visit: Payer: Self-pay | Admitting: Internal Medicine

## 2021-08-09 DIAGNOSIS — E2839 Other primary ovarian failure: Secondary | ICD-10-CM

## 2021-12-22 ENCOUNTER — Other Ambulatory Visit: Payer: Self-pay | Admitting: Internal Medicine

## 2021-12-23 LAB — COMPLETE METABOLIC PANEL WITH GFR
AG Ratio: 1.7 (calc) (ref 1.0–2.5)
ALT: 22 U/L (ref 6–29)
AST: 17 U/L (ref 10–35)
Albumin: 3.9 g/dL (ref 3.6–5.1)
Alkaline phosphatase (APISO): 67 U/L (ref 37–153)
BUN: 22 mg/dL (ref 7–25)
CO2: 26 mmol/L (ref 20–32)
Calcium: 9.2 mg/dL (ref 8.6–10.4)
Chloride: 103 mmol/L (ref 98–110)
Creat: 0.94 mg/dL (ref 0.50–1.05)
Globulin: 2.3 g/dL (calc) (ref 1.9–3.7)
Glucose, Bld: 78 mg/dL (ref 65–99)
Potassium: 4.4 mmol/L (ref 3.5–5.3)
Sodium: 137 mmol/L (ref 135–146)
Total Bilirubin: 0.5 mg/dL (ref 0.2–1.2)
Total Protein: 6.2 g/dL (ref 6.1–8.1)
eGFR: 68 mL/min/{1.73_m2} (ref >=60)

## 2021-12-23 LAB — LIPID PANEL
Cholesterol: 193 mg/dL (ref <200)
HDL: 86 mg/dL (ref >=50)
LDL Cholesterol (Calc): 93 mg/dL (calc)
Non-HDL Cholesterol (Calc): 107 mg/dL (calc) (ref <130)
Total CHOL/HDL Ratio: 2.2 (calc) (ref <5.0)
Triglycerides: 57 mg/dL (ref <150)

## 2021-12-23 LAB — EXTRA LAV TOP TUBE

## 2022-02-28 ENCOUNTER — Other Ambulatory Visit: Payer: Self-pay | Admitting: Internal Medicine

## 2022-02-28 DIAGNOSIS — Z1231 Encounter for screening mammogram for malignant neoplasm of breast: Secondary | ICD-10-CM

## 2022-03-09 ENCOUNTER — Other Ambulatory Visit: Payer: Self-pay

## 2022-03-09 ENCOUNTER — Emergency Department (HOSPITAL_COMMUNITY): Payer: Medicare Other

## 2022-03-09 ENCOUNTER — Emergency Department (HOSPITAL_COMMUNITY)
Admission: EM | Admit: 2022-03-09 | Discharge: 2022-03-09 | Disposition: A | Discharge status: Home / Self Care | Payer: Medicare Other | Attending: Emergency Medicine | Admitting: Emergency Medicine

## 2022-03-09 ENCOUNTER — Encounter (HOSPITAL_COMMUNITY): Payer: Self-pay | Admitting: Emergency Medicine

## 2022-03-09 DIAGNOSIS — W010XXA Fall on same level from slipping, tripping and stumbling without subsequent striking against object, initial encounter: Secondary | ICD-10-CM | POA: Insufficient documentation

## 2022-03-09 DIAGNOSIS — S20212A Contusion of left front wall of thorax, initial encounter: Secondary | ICD-10-CM | POA: Diagnosis not present

## 2022-03-09 DIAGNOSIS — S20302A Unspecified superficial injuries of left front wall of thorax, initial encounter: Secondary | ICD-10-CM | POA: Diagnosis present

## 2022-03-09 MED ORDER — ONDANSETRON 4 MG PO TBDP
4.0000 mg | ORAL_TABLET | Freq: Once | ORAL | Status: AC
  Administered 2022-03-09: 4 mg via ORAL
  Filled 2022-03-09: qty 1

## 2022-03-09 MED ORDER — LIDOCAINE 5 % EX PTCH: 1.0000 | MEDICATED_PATCH | CUTANEOUS | 0 refills | Status: AC

## 2022-03-09 MED ORDER — ACETAMINOPHEN 325 MG PO TABS
650.0000 mg | ORAL_TABLET | Freq: Once | ORAL | Status: AC
  Administered 2022-03-09: 650 mg via ORAL
  Filled 2022-03-09: qty 2

## 2022-03-09 MED ORDER — IBUPROFEN 400 MG PO TABS
600.0000 mg | ORAL_TABLET | Freq: Once | ORAL | Status: AC
  Administered 2022-03-09: 600 mg via ORAL
  Filled 2022-03-09: qty 1

## 2022-03-09 MED ORDER — OXYCODONE-ACETAMINOPHEN 5-325 MG PO TABS
1.0000 | ORAL_TABLET | Freq: Once | ORAL | Status: AC
  Administered 2022-03-09: 1 via ORAL
  Filled 2022-03-09: qty 1

## 2022-03-09 MED ORDER — LIDOCAINE 5 % EX PTCH
1.0000 | MEDICATED_PATCH | Freq: Once | CUTANEOUS | Status: DC
  Administered 2022-03-09: 1 via TRANSDERMAL
  Filled 2022-03-09: qty 1

## 2022-03-09 MED ORDER — OXYCODONE-ACETAMINOPHEN 5-325 MG PO TABS: 1.0000 | ORAL_TABLET | Freq: Four times a day (QID) | ORAL | 0 refills | Status: DC | PRN

## 2022-03-09 NOTE — ED Provider Notes (Signed)
[facility] EMERGENCY DEPARTMENT Provider Note   CSN: [id number] Arrival date & time: 03/09/22  1933     History  Chief Complaint  Patient presents with   Lytle Michaels    [name] is a 66 y.o. female.  HPI 66 year old female presents after a fall and left rib injury.  She was visiting her husband in the hospital when she tripped leaving the room and landed on her left chest on the ground.  Did not hit her head or lose consciousness.  She is having severe pain.  She was given Percocet in the triage area and states it has improved her pain.  There is no dyspnea though it is painful to take a breath in.  Home Medications Prior to Admission medications   Medication Sig Start Date End Date Taking? Authorizing Provider  lidocaine (LIDODERM) 5 % Place 1 patch onto the skin daily. Remove & Discard patch within 12 hours or as directed by MD 03/09/22  Yes Sherwood Gambler, MD  oxyCODONE-acetaminophen (PERCOCET/ROXICET) 5-325 MG tablet Take 1 tablet by mouth every 6 (six) hours as needed for severe pain. 03/09/22  Yes Sherwood Gambler, MD  amLODipine (NORVASC) 5 MG tablet Take 5 mg by mouth daily.    [provider]  benzonatate (TESSALON) 100 MG capsule Take 1 capsule (100 mg total) by mouth every 8 (eight) hours. 07/14/18   Wurst, [place], PA-C  cetirizine (ZYRTEC) 10 MG chewable tablet Chew 1 tablet (10 mg total) by mouth daily. 07/14/18   Wurst, [place], PA-C  ciprofloxacin (CIPRO) 250 MG tablet Take 250 mg by mouth 2 (two) times daily.    [provider]  ergocalciferol (VITAMIN D2) 50000 UNITS capsule Take 50,000 Units by mouth once a week.    [provider]  fluticasone (FLONASE) 50 MCG/ACT nasal spray Place 2 sprays into both nostrils daily. 07/14/18   Wurst, [place], PA-C  gabapentin (NEURONTIN) 100 MG capsule Take 100 mg by mouth 3 (three) times daily.    [provider]  lisinopril-hydrochlorothiazide (PRINZIDE,ZESTORETIC) 20-25 MG per tablet  Take 1 tablet by mouth daily.    [provider]  Luliconazole 1 % CREA Apply 1 application topically 2 (two) times daily before a meal.    [provider]  OVER THE COUNTER MEDICATION Take 1 capsule by mouth 2 (two) times daily before a meal. Omega XL- for joints    [provider]  PRESCRIPTION MEDICATION Apply 1 application topically daily. Jublia 10% to toe fungus daily    [provider]  terbinafine (LAMISIL) 250 MG tablet Take 250 mg by mouth daily.    [provider]      Allergies    Penicillins    Review of Systems   Review of Systems  Respiratory:  Negative for shortness of breath.   Cardiovascular:  Positive for chest pain.  Gastrointestinal:  Negative for abdominal pain.  Neurological:  Negative for headaches.    Physical Exam Updated Vital Signs BP 136/79 (BP Location: Right Arm)   Pulse 95   Temp 98.1 F (36.7 C) (Oral)   Resp 18   Wt 92.5 kg   SpO2 100%   BMI 32.93 kg/m  Physical Exam Vitals and nursing note reviewed.  Constitutional:      Appearance: She is well-developed.  HENT:     Head: Normocephalic and atraumatic.  Cardiovascular:     Rate and Rhythm: Normal rate and regular rhythm.     Heart sounds: Normal heart sounds.  Pulmonary:     Effort: Pulmonary effort is normal.     Breath sounds: Normal breath sounds.  Chest:     Chest wall: Tenderness present.    Abdominal:     General: There is no distension.     Palpations: Abdomen is soft.     Tenderness: There is no abdominal tenderness.  Skin:    General: Skin is warm and dry.  Neurological:     Mental Status: She is alert.     ED Results / Procedures / Treatments   Labs (all labs ordered are listed, but only abnormal results are displayed) Labs Reviewed - No data to display  EKG None  Radiology DG Ribs Unilateral W/Chest Left  Result Date: 03/09/2022 CLINICAL DATA:  Rib injury. EXAM: LEFT RIBS AND CHEST - 3+ VIEW COMPARISON:  Chest  x-ray 04/12/2004 FINDINGS: No fracture or other bone lesions are seen involving the ribs. There is no evidence of pneumothorax or pleural effusion. Both lungs are clear. Heart size and mediastinal contours are within normal limits. IMPRESSION: Negative. Electronically Signed   By: Darliss Cheney M.D.   On: 03/09/2022 20:57    Procedures Procedures    Medications Ordered in ED Medications  lidocaine (LIDODERM) 5 % 1 patch (has no administration in time range)  ibuprofen (ADVIL) tablet 600 mg (has no administration in time range)  acetaminophen (TYLENOL) tablet 650 mg (has no administration in time range)  oxyCODONE-acetaminophen (PERCOCET/ROXICET) 5-325 MG per tablet 1 tablet (1 tablet Oral Given 03/09/22 2032)  ondansetron (ZOFRAN-ODT) disintegrating tablet 4 mg (4 mg Oral Given 03/09/22 2032)    ED Course/ Medical Decision Making/ A&P                           Medical Decision Making Amount and/or Complexity of Data Reviewed Radiology: independent interpretation performed.    Details: No obvious rib fracture. No pneumothorax.   Risk OTC drugs. Prescription drug management.   Patient presents with significant pain after a fall.  Likely has occult rib fractures based on degree of pain.  No obvious pneumothorax.  Oxygen saturation is 100%.  I do not think CT is emergently warranted.  She was given Percocet in triage and feels better.  We will also give ibuprofen, Tylenol, Lidoderm.  Will prescribe short course of oxycodone and given incentive spirometer.  No other trauma.  No abdominal pain.  Appears stable for discharge and she is eager to be discharged to go back up to be with her husband who is in the hospital.  She was given return precautions.        Final Clinical Impression(s) / ED Diagnoses Final diagnoses:  Rib contusion, left, initial encounter    Rx / DC Orders ED Discharge Orders          Ordered    oxyCODONE-acetaminophen (PERCOCET/ROXICET) 5-325 MG tablet  Every  6 hours PRN        03/09/22 2158    lidocaine (LIDODERM) 5 %  Every 24 hours        03/09/22 2158              Pricilla Loveless, MD 03/09/22 2221

## 2022-03-09 NOTE — ED Provider Triage Note (Signed)
Emergency Medicine Provider Triage Evaluation Note  [name] , a 66 y.o. female  was evaluated in triage.  Pt complains of left-sided rib injury.  Patient had a mechanical fall today fell onto her left rib cage.  Complaining of severe pain with deep breathing on the left.  She did not hit her head or lose consciousness..  Review of Systems  Positive: Rib pain Negative: Loss of consciousness  Physical Exam  BP 136/79 (BP Location: Right Arm)   Pulse 95   Temp 98.1 F (36.7 C) (Oral)   Resp 18   Wt 92.5 kg   SpO2 100%   BMI 32.93 kg/m  Gen:   Awake, no distress   Resp:  Guarded effort  MSK:   Moves extremities without difficulty  Other:  Guarded breathing, exquisitely tender to palpation over the left rib cage  Medical Decision Making  Medically screening exam initiated at 7:54 PM.  Appropriate orders placed.  Jennika Ken was informed that the remainder of the evaluation will be completed by another provider, this initial triage assessment does not replace that evaluation, and the importance of remaining in the ED until their evaluation is complete.  Work-up initiated   Margarita Mail, PA-C 03/09/22 1957

## 2022-03-09 NOTE — Discharge Instructions (Signed)
You either bruised or broke 1 or more of your ribs on the left side.  This will take weeks to heal though the worst of it should be over the first couple days.  You are being given stronger pain medicine to help with this.  Do not drive or operate heavy machinery with this.  You may also take Tylenol and ibuprofen.  You are being given numbing medicine as well.  If at any point you feel like you are having trouble breathing or your pain worsens or does not improve or you develop any other new/concerning symptoms then return to the ER for evaluation.

## 2022-03-09 NOTE — ED Triage Notes (Signed)
Mechanical fall while visiting the hospital, injury to L ribs, no other areas injured. Bruising noted to L ribs, pain with deep breath. Denies blood thinners. Denies head injury or LOC H/o DM

## 2022-03-09 NOTE — ED Notes (Signed)
All discharge instructions reviewed with patient including follow up care and prescriptions. Patient verbalized understanding of same and had no other questions. Patient stable and ambulatory at time of discharge.  

## 2022-04-01 ENCOUNTER — Ambulatory Visit: Payer: Medicare Other

## 2022-04-07 ENCOUNTER — Ambulatory Visit: Payer: Medicare Other | Admitting: Obstetrics and Gynecology

## 2022-04-29 ENCOUNTER — Ambulatory Visit
Admission: RE | Admit: 2022-04-29 | Discharge: 2022-04-29 | Disposition: A | Discharge status: Home / Self Care | Payer: Medicare Other | Source: Ambulatory Visit | Attending: Internal Medicine | Admitting: Internal Medicine

## 2022-04-29 DIAGNOSIS — Z1231 Encounter for screening mammogram for malignant neoplasm of breast: Secondary | ICD-10-CM

## 2022-05-10 ENCOUNTER — Ambulatory Visit (INDEPENDENT_AMBULATORY_CARE_PROVIDER_SITE_OTHER): Payer: Medicare Other | Admitting: Family Medicine

## 2022-05-10 ENCOUNTER — Other Ambulatory Visit (HOSPITAL_COMMUNITY)
Admission: RE | Admit: 2022-05-10 | Discharge: 2022-05-10 | Disposition: A | Discharge status: Home / Self Care | Payer: Medicare Other | Source: Ambulatory Visit | Attending: Obstetrics and Gynecology | Admitting: Obstetrics and Gynecology

## 2022-05-10 ENCOUNTER — Encounter: Payer: Self-pay | Admitting: Family Medicine

## 2022-05-10 VITALS — BP 118/76 | HR 88 | Ht 66.0 in | Wt 195.7 lb

## 2022-05-10 DIAGNOSIS — Z1151 Encounter for screening for human papillomavirus (HPV): Secondary | ICD-10-CM | POA: Diagnosis not present

## 2022-05-10 DIAGNOSIS — Z01419 Encounter for gynecological examination (general) (routine) without abnormal findings: Secondary | ICD-10-CM | POA: Insufficient documentation

## 2022-05-10 DIAGNOSIS — Z124 Encounter for screening for malignant neoplasm of cervix: Secondary | ICD-10-CM | POA: Diagnosis not present

## 2022-05-10 DIAGNOSIS — Z0000 Encounter for general adult medical examination without abnormal findings: Secondary | ICD-10-CM | POA: Diagnosis not present

## 2022-05-10 NOTE — Progress Notes (Addendum)
GYNECOLOGY OFFICE VISIT NOTE  History:   [name] is a 66 y.o. G2P0 here today for an annual physical exam. She denies any abnormal vaginal discharge, bleeding, pelvic pain or other concerns.   Last pap smear was back in 2007. She is not sexually active. She has had a mammogram done recently which came back negative. She had a colonoscopy back in 09/2014 which was normal, next is due in 10 years - 2026. She has no concerns otherwise today.   Past Medical History:  Diagnosis Date   Arthritis    Borderline diabetes    GERD (gastroesophageal reflux disease)    Hyperlipidemia    Hypertension    Lumbar herniated disc    Neuromuscular disorder (HCC)    Fibromyalgia   Onychomycosis    UTI (lower urinary tract infection)    Wears glasses     Past Surgical History:  Procedure Laterality Date   BILATERAL CARPAL TUNNEL RELEASE  2021   LUMBAR DISC SURGERY      The following portions of the patient's history were reviewed and updated as appropriate: allergies, current medications, past family history, past medical history, past social history, past surgical history and problem list.   Health Maintenance:  pap collected today. Recent mammogram in 2023 normal.  Review of Systems:  Pertinent items noted in HPI and remainder of comprehensive ROS otherwise negative.  Physical Exam:  BP 118/76   Pulse 88   Ht 5\' 6"  (1.676 m)   Wt 195 lb 11.2 oz (88.8 kg)   BMI 31.59 kg/m  CONSTITUTIONAL: Well-developed, well-nourished female in no acute distress.  HEENT:  Normocephalic, atraumatic. External right and left ear normal. No scleral icterus.  NECK: Normal range of motion, supple, no masses noted on observation SKIN: No rash noted. Not diaphoretic. No erythema. No pallor. MUSCULOSKELETAL: Normal range of motion. No edema noted. NEUROLOGIC: Alert and oriented to person, place, and time. Normal muscle tone coordination. No cranial nerve deficit noted. PSYCHIATRIC: Normal mood and affect.  Normal behavior. Normal judgment and thought content. CARDIOVASCULAR: Normal heart rate noted RESPIRATORY: Effort and breath sounds normal, no problems with respiration noted ABDOMEN: No masses noted. No other overt distention noted.   PELVIC: Normal appearing external genitalia; normal urethral meatus; normal appearing vaginal mucosa and cervix.  No abnormal discharge noted.  Normal uterine size, no other palpable masses, no uterine or adnexal tenderness. Performed in the presence of a chaperone  Labs and Imaging No results found for this or any previous visit (from the past 168 hour(s)). MM 3D SCREEN BREAST BILATERAL  Result Date: 05/03/2022 CLINICAL DATA:  Screening. EXAM: DIGITAL SCREENING BILATERAL MAMMOGRAM WITH TOMOSYNTHESIS AND CAD TECHNIQUE: Bilateral screening digital craniocaudal and mediolateral oblique mammograms were obtained. Bilateral screening digital breast tomosynthesis was performed. The images were evaluated with computer-aided detection. COMPARISON:  Previous exam(s). ACR Breast Density Category a: The breast tissue is almost entirely fatty. FINDINGS: There are no findings suspicious for malignancy. IMPRESSION: No mammographic evidence of malignancy. A result letter of this screening mammogram will be mailed directly to the patient. RECOMMENDATION: Screening mammogram in one year. (Code:[id number]) BI-RADS CATEGORY  1: Negative. Electronically Signed   By: 05/05/2022 M.D.   On: 05/03/2022 12:38      Assessment and Plan:   Encounter for annual routine gynecological examination  Cervical cancer screening  Generally healthy, doing well overall. UTD with h/maintenance. Pap smear collected today. If normal, will not need any further pap smears, given age.  Routine  preventative health maintenance measures emphasized. Please refer to After Visit Summary for other counseling recommendations.   Follow up - as needed, or in 1 year for annual gyn exam.  I spent 20 minutes  dedicated to the care of this patient including pre-visit review of records, face to face time with the patient discussing her conditions and treatments and post visit orders.  Sheppard Evens MD MPH OB Fellow, Faculty Practice [facility], Center for [facility] 05/10/2022

## 2022-05-10 NOTE — Addendum Note (Signed)
Addended by: Alfredia Ferguson on: 05/10/2022 05:04 PM   Modules accepted: Orders

## 2022-05-10 NOTE — Progress Notes (Signed)
Patient presents as a New Patient for an AEX. Denies having any vaginal issues.  Last pap: 2007 Normal Last MM: 04/29/22 Normal

## 2022-05-13 LAB — CYTOLOGY - PAP
Comment: NEGATIVE
Diagnosis: NEGATIVE
High risk HPV: NEGATIVE

## 2022-05-23 ENCOUNTER — Telehealth: Payer: Self-pay | Admitting: Emergency Medicine

## 2022-05-23 NOTE — Telephone Encounter (Signed)
Attempted TC to discuss results with patient, x2. LVM

## 2022-05-23 NOTE — Telephone Encounter (Signed)
-----   Message from Alfredia Ferguson, MD sent at 05/20/2022 11:50 AM EST ----- Please inform patient of negative pap smear. She does not need to continue having any pap smears from now on, due to age.

## 2022-05-26 ENCOUNTER — Telehealth: Payer: Self-pay | Admitting: Emergency Medicine

## 2022-05-26 NOTE — Telephone Encounter (Signed)
Attempted TC to patient x1 and attempted x2 to daughter.  Unable to leave message.

## 2022-05-26 NOTE — Telephone Encounter (Signed)
-----   Message from Chiagoziem J Ndulue, MD sent at 05/20/2022 11:50 AM EST ----- Please inform patient of negative pap smear. She does not need to continue having any pap smears from now on, due to age. 

## 2022-06-22 ENCOUNTER — Other Ambulatory Visit: Payer: Self-pay | Admitting: Internal Medicine

## 2022-06-23 LAB — COMPLETE METABOLIC PANEL WITH GFR
AG Ratio: 1.8 (calc) (ref 1.0–2.5)
ALT: 17 U/L (ref 6–29)
AST: 19 U/L (ref 10–35)
Albumin: 4.4 g/dL (ref 3.6–5.1)
Alkaline phosphatase (APISO): 77 U/L (ref 37–153)
BUN: 17 mg/dL (ref 7–25)
CO2: 25 mmol/L (ref 20–32)
Calcium: 9.9 mg/dL (ref 8.6–10.4)
Chloride: 104 mmol/L (ref 98–110)
Creat: 0.87 mg/dL (ref 0.50–1.05)
Globulin: 2.5 g/dL (calc) (ref 1.9–3.7)
Glucose, Bld: 92 mg/dL (ref 65–99)
Potassium: 4 mmol/L (ref 3.5–5.3)
Sodium: 141 mmol/L (ref 135–146)
Total Bilirubin: 0.6 mg/dL (ref 0.2–1.2)
Total Protein: 6.9 g/dL (ref 6.1–8.1)
eGFR: 74 mL/min/{1.73_m2} (ref >=60)

## 2022-06-23 LAB — CBC
HCT: 38.1 % (ref 35.0–45.0)
Hemoglobin: 12.6 g/dL (ref 11.7–15.5)
MCH: 27.9 pg (ref 27.0–33.0)
MCHC: 33.1 g/dL (ref 32.0–36.0)
MCV: 84.3 fL (ref 80.0–100.0)
MPV: 12.6 fL — ABNORMAL HIGH (ref 7.5–12.5)
Platelets: 228 10*3/uL (ref 140–400)
RBC: 4.52 10*6/uL (ref 3.80–5.10)
RDW: 14 % (ref 11.0–15.0)
WBC: 5.5 10*3/uL (ref 3.8–10.8)

## 2022-06-23 LAB — LIPID PANEL
Cholesterol: 210 mg/dL — ABNORMAL HIGH (ref <200)
HDL: 72 mg/dL (ref >=50)
LDL Cholesterol (Calc): 123 mg/dL (calc) — ABNORMAL HIGH
Non-HDL Cholesterol (Calc): 138 mg/dL (calc) — ABNORMAL HIGH (ref <130)
Total CHOL/HDL Ratio: 2.9 (calc) (ref <5.0)
Triglycerides: 61 mg/dL (ref <150)

## 2022-06-23 LAB — TSH: TSH: 0.78 mIU/L (ref 0.40–4.50)

## 2022-06-23 LAB — VITAMIN D 25 HYDROXY (VIT D DEFICIENCY, FRACTURES): Vit D, 25-Hydroxy: 57 ng/mL (ref 30–100)

## 2022-07-11 ENCOUNTER — Encounter: Payer: Self-pay | Admitting: Gastroenterology

## 2022-07-21 ENCOUNTER — Other Ambulatory Visit: Payer: Self-pay | Admitting: Internal Medicine

## 2022-07-21 DIAGNOSIS — E2839 Other primary ovarian failure: Secondary | ICD-10-CM

## 2022-08-03 ENCOUNTER — Encounter: Payer: Self-pay | Admitting: Gastroenterology

## 2022-08-03 ENCOUNTER — Ambulatory Visit (INDEPENDENT_AMBULATORY_CARE_PROVIDER_SITE_OTHER): Payer: 59 | Admitting: Gastroenterology

## 2022-08-03 VITALS — BP 126/72 | HR 74 | Ht 65.0 in | Wt 200.5 lb

## 2022-08-03 DIAGNOSIS — R1310 Dysphagia, unspecified: Secondary | ICD-10-CM | POA: Diagnosis not present

## 2022-08-03 NOTE — Progress Notes (Signed)
08/03/2022 [name] [id number] 21-Jan-1956   HISTORY OF PRESENT ILLNESS: This is a 67 year old female who was previously a patient of Dr. Nichola Sizer, known to her only for colonoscopy in 2016.  She is here today at the request of her PCP, Dr. Jeanie Cooks, for evaluation regarding dysphagia.  She tells me that for the past 4 to 5 months she has had somewhat progressive dysphagia.  She says that she feels like she has a lump in her throat making it hard to swallow food, water, pills.  She says that sometimes she feels like there is a bubble in there.  Says that when she drinks she chokes and starts coughing.  She said that her weight has gone down about 40 pounds.  She is very worried about it.  She was under the impression that she was getting an EGD today.  Says she has been treated for acid reflux on and off over the years.  Had been off of treatment, but recently started famotidine 20 mg twice daily again just 2 or 3 months ago.   Past Medical History:  Diagnosis Date   Arthritis    Borderline diabetes    GERD (gastroesophageal reflux disease)    Hyperlipidemia    Hypertension    Lumbar herniated disc    Neuromuscular disorder (HCC)    Fibromyalgia   Onychomycosis    UTI (lower urinary tract infection)    Wears glasses    Past Surgical History:  Procedure Laterality Date   BILATERAL CARPAL TUNNEL RELEASE  2021   LUMBAR [place]      reports that she has never smoked. She has never used smokeless tobacco. She reports current alcohol use. She reports that she does not use drugs. family history includes Glaucoma in her mother. Allergies  Allergen Reactions   Penicillins Nausea And Vomiting    tremors      Outpatient Encounter Medications as of 08/03/2022  Medication Sig   amLODipine (NORVASC) 5 MG tablet Take 5 mg by mouth daily.   ASPIRIN 81 PO Take 81 mg by mouth daily in the afternoon.   atorvastatin (LIPITOR) 20 MG tablet Take 20 mg by mouth at bedtime.   BREO  ELLIPTA 200-25 MCG/ACT AEPB 1 puff daily.   cetirizine (ZYRTEC) 10 MG chewable tablet Chew 1 tablet (10 mg total) by mouth daily.   Cholecalciferol 1.25 MG (50000 UT) capsule Take 1 capsule by mouth once a week.   diclofenac Sodium (VOLTAREN) 1 % GEL SMARTSIG:4 Gram(s) Topical 4 Times Daily PRN   dorzolamide-timolol (COSOPT) 2-0.5 % ophthalmic solution SMARTSIG:In Eye(s)   ergocalciferol (VITAMIN D2) 50000 UNITS capsule Take 50,000 Units by mouth once a week.   famotidine (PEPCID) 20 MG tablet Take 20 mg by mouth 2 (two) times daily.   fluticasone (FLONASE) 50 MCG/ACT nasal spray Place 2 sprays into both nostrils daily.   furosemide (LASIX) 20 MG tablet    gabapentin (NEURONTIN) 100 MG capsule Take 100 mg by mouth 3 (three) times daily.   ibuprofen (ADVIL) 800 MG tablet Take 800 mg by mouth 3 (three) times daily as needed.   lidocaine (LIDODERM) 5 % Place 1 patch onto the skin daily. Remove & Discard patch within 12 hours or as directed by MD   lisinopril-hydrochlorothiazide (PRINZIDE,ZESTORETIC) 20-25 MG per tablet Take 1 tablet by mouth daily.   lubiprostone (AMITIZA) 24 MCG capsule    Luliconazole 1 % CREA Apply 1 application topically 2 (two) times daily before a meal.  naloxegol oxalate (MOVANTIK) 25 MG TABS tablet    OVER THE COUNTER MEDICATION Take 1 capsule by mouth 2 (two) times daily before a meal. Omega XL- for joints   oxyCODONE-acetaminophen (PERCOCET/ROXICET) 5-325 MG tablet Take 1 tablet by mouth every 6 (six) hours as needed for severe pain.   predniSONE (DELTASONE) 20 MG tablet Take by mouth.   PRESCRIPTION MEDICATION Apply 1 application topically daily. Jublia 10% to toe fungus daily   tiZANidine (ZANAFLEX) 4 MG tablet    TRELEGY ELLIPTA 200-62.5-25 MCG/ACT AEPB Inhale 1 puff into the lungs daily.   [DISCONTINUED] terbinafine (LAMISIL) 250 MG tablet Take 250 mg by mouth daily.   No facility-administered encounter medications on file as of 08/03/2022.     REVIEW OF  SYSTEMS  : All other systems reviewed and negative except where noted in the History of Present Illness.   PHYSICAL EXAM: BP 126/72   Pulse 74   Ht 5' 5"$  (1.651 m)   Wt 200 lb 8 oz (90.9 kg)   BMI 33.36 kg/m  General: Well developed AA female in no acute distress Head: Normocephalic and atraumatic Eyes:  Sclerae anicteric, conjunctiva pink. Ears: Normal auditory acuity Lungs: Clear throughout to auscultation; no W/R/R. Heart: Regular rate and rhythm; no M/R/G. Abdomen: Soft, non-distended.  BS present.  Minimal epigastric TTP. Musculoskeletal: Symmetrical with no gross deformities  Skin: No lesions on visible extremities Extremities: No edema  Neurological: Alert oriented x 4, grossly non-focal Psychological:  Alert and cooperative. Normal mood and affect  ASSESSMENT AND PLAN: *Dysphagia: Having dysphagia to solids, liquids, and pills have been progressive over the last several months.  Says that she feels like there is a lump in her throat making it hard to swallow.  Says that she chokes and coughs.  Has lost 40 pounds, somewhat unintentional.  Sounds like this could be more of an oropharyngeal swallowing issue, but nonetheless patient was prepped by her primary care physician that she would likely get an endoscopy and was expecting to have that done today.  Will plan for EGD with Dr. Bryan Lemma tomorrow with possible dilation of the esophagus pending findings.  If that looks unremarkable then may need modified barium swallow with speech and/or other evaluation.  CC:  Nolene Ebbs, MD

## 2022-08-03 NOTE — Progress Notes (Signed)
Agree with the assessment and plan as outlined by Jessica Zehr, PA-C. ? ?Sharlee Rufino, DO, FACG ? ?

## 2022-08-03 NOTE — Patient Instructions (Signed)
You have been scheduled for an endoscopy. Please follow written instructions given to you at your visit today. If you use inhalers (even only as needed), please bring them with you on the day of your procedure.  _______________________________________________________  If your blood pressure at your visit was 140/90 or greater, please contact your primary care physician to follow up on this.  _______________________________________________________  If you are age 66 or older, your body mass index should be between 23-30. Your Body mass index is 33.36 kg/m. If this is out of the aforementioned range listed, please consider follow up with your Primary Care Provider.  If you are age 43 or younger, your body mass index should be between 19-25. Your Body mass index is 33.36 kg/m. If this is out of the aformentioned range listed, please consider follow up with your Primary Care Provider.   ________________________________________________________  The [place] [facility] providers would like to encourage you to use [facility] to communicate with providers for non-urgent requests or questions.  Due to long hold times on the telephone, sending your provider a message by [facility] may be a faster and more efficient way to get a response.  Please allow 48 business hours for a response.  Please remember that this is for non-urgent requests.  _______________________________________________________

## 2022-08-04 ENCOUNTER — Encounter: Payer: Self-pay | Admitting: Gastroenterology

## 2022-08-04 ENCOUNTER — Ambulatory Visit (AMBULATORY_SURGERY_CENTER): Payer: 59 | Admitting: Gastroenterology

## 2022-08-04 ENCOUNTER — Encounter: Payer: 59 | Admitting: Gastroenterology

## 2022-08-04 VITALS — BP 120/69 | HR 74 | Temp 97.8°F | Resp 18 | Ht 65.0 in | Wt 200.0 lb

## 2022-08-04 DIAGNOSIS — K2970 Gastritis, unspecified, without bleeding: Secondary | ICD-10-CM

## 2022-08-04 DIAGNOSIS — K2950 Unspecified chronic gastritis without bleeding: Secondary | ICD-10-CM | POA: Diagnosis not present

## 2022-08-04 DIAGNOSIS — B9681 Helicobacter pylori [H. pylori] as the cause of diseases classified elsewhere: Secondary | ICD-10-CM

## 2022-08-04 DIAGNOSIS — R1310 Dysphagia, unspecified: Secondary | ICD-10-CM | POA: Diagnosis not present

## 2022-08-04 DIAGNOSIS — K219 Gastro-esophageal reflux disease without esophagitis: Secondary | ICD-10-CM

## 2022-08-04 HISTORY — PX: UPPER GASTROINTESTINAL ENDOSCOPY: SHX188

## 2022-08-04 MED ORDER — SODIUM CHLORIDE 0.9 % IV SOLN: 500.0000 mL | Freq: Once | INTRAVENOUS | Status: DC

## 2022-08-04 NOTE — Progress Notes (Signed)
Pt's states no medical or surgical changes since previsit or office visit. 

## 2022-08-04 NOTE — Progress Notes (Signed)
Vss nad trans to pacu °

## 2022-08-04 NOTE — Progress Notes (Signed)
GASTROENTEROLOGY PROCEDURE H&P NOTE   Primary Care Physician: Nolene Ebbs, MD    Reason for Procedure:   Dysphagia, weight loss, GERD  Plan:    EGD with dilation and/or biopsies  Patient is appropriate for endoscopic procedure(s) in the ambulatory ([place]) setting.  The nature of the procedure, as well as the risks, benefits, and alternatives were carefully and thoroughly reviewed with the patient. Ample time for discussion and questions allowed. The patient understood, was satisfied, and agreed to proceed.     HPI: [name] is a 67 y.o. female who presents for EGD for evaluation of dysphagia, weight loss, and hx of GERD .  Patient was most recently seen in the Gastroenterology Clinic on 08/03/2022.  No interval change in medical history since that appointment. Please refer to that note for full details regarding GI history and clinical presentation.   Past Medical History:  Diagnosis Date   Arthritis    Borderline diabetes    GERD (gastroesophageal reflux disease)    Hyperlipidemia    Hypertension    Lumbar herniated disc    Neuromuscular disorder (HCC)    Fibromyalgia   Onychomycosis    UTI (lower urinary tract infection)    Wears glasses     Past Surgical History:  Procedure Laterality Date   BILATERAL CARPAL TUNNEL RELEASE  2021   COLONOSCOPY     LUMBAR DISC SURGERY     UPPER GASTROINTESTINAL ENDOSCOPY  08/04/2022    Prior to Admission medications   Medication Sig Start Date End Date Taking? Authorizing Provider  amLODipine (NORVASC) 10 MG tablet Take 10 mg by mouth daily. 05/12/22  Yes [provider]  ASPIRIN 81 PO Take 81 mg by mouth daily in the afternoon.   Yes [provider]  atorvastatin (LIPITOR) 20 MG tablet Take 20 mg by mouth at bedtime.   Yes [provider]  BREO ELLIPTA 200-25 MCG/ACT AEPB 1 puff daily. 11/21/21  Yes [provider]  cetirizine (ZYRTEC) 10 MG tablet SMARTSIG:1 Tablet(s) By Mouth Every  Evening 07/01/22  Yes [provider]  Cholecalciferol 1.25 MG (50000 UT) capsule Take 1 capsule by mouth once a week.   Yes [provider]  diclofenac Sodium (VOLTAREN) 1 % GEL SMARTSIG:4 Gram(s) Topical 4 Times Daily PRN 01/11/22  Yes [provider]  dorzolamide-timolol (COSOPT) 2-0.5 % ophthalmic solution SMARTSIG:In Eye(s) 05/01/22  Yes [provider]  famotidine (PEPCID) 20 MG tablet Take 20 mg by mouth 2 (two) times daily. 04/15/22  Yes [provider]  fluticasone (FLONASE) 50 MCG/ACT nasal spray Place 2 sprays into both nostrils daily. 07/14/18  Yes [place], [place], PA-C  furosemide (LASIX) 40 MG tablet Take 40 mg by mouth daily as needed. 05/23/22  Yes [provider]  ibuprofen (ADVIL) 800 MG tablet Take 800 mg by mouth 3 (three) times daily as needed.   Yes [provider]  latanoprost (XALATAN) 0.005 % ophthalmic solution 1 drop at bedtime. 07/30/22  Yes [provider]  lisinopril-hydrochlorothiazide (PRINZIDE,ZESTORETIC) 20-25 MG per tablet Take 1 tablet by mouth daily.   Yes [provider]  lubiprostone (AMITIZA) 24 MCG capsule    Yes [provider]  naloxegol oxalate (MOVANTIK) 25 MG TABS tablet    Yes [provider]  OVER THE COUNTER MEDICATION Take 1 capsule by mouth 2 (two) times daily before a meal. Omega XL- for joints   Yes [provider]  pantoprazole (PROTONIX) 40 MG tablet Take 40 mg by mouth  2 (two) times daily. 06/22/22  Yes [provider]  Donnal Debar 200-62.5-25 MCG/ACT AEPB Inhale 1 puff into the lungs daily. 04/06/22  Yes [provider]  lidocaine (LIDODERM) 5 % Place 1 patch onto the skin daily. Remove & Discard patch within 12 hours or as directed by MD Patient not taking: Reported on 08/04/2022 03/09/22   Sherwood Gambler, MD  predniSONE (DELTASONE) 20 MG tablet Take by mouth. 12/22/21   [provider]  tiZANidine (ZANAFLEX) 4 MG  tablet     [provider]    Current Outpatient Medications  Medication Sig Dispense Refill   amLODipine (NORVASC) 10 MG tablet Take 10 mg by mouth daily.     ASPIRIN 81 PO Take 81 mg by mouth daily in the afternoon.     atorvastatin (LIPITOR) 20 MG tablet Take 20 mg by mouth at bedtime.     BREO ELLIPTA 200-25 MCG/ACT AEPB 1 puff daily.     cetirizine (ZYRTEC) 10 MG tablet SMARTSIG:1 Tablet(s) By Mouth Every Evening     Cholecalciferol 1.25 MG (50000 UT) capsule Take 1 capsule by mouth once a week.     diclofenac Sodium (VOLTAREN) 1 % GEL SMARTSIG:4 Gram(s) Topical 4 Times Daily PRN     dorzolamide-timolol (COSOPT) 2-0.5 % ophthalmic solution SMARTSIG:In Eye(s)     famotidine (PEPCID) 20 MG tablet Take 20 mg by mouth 2 (two) times daily.     fluticasone (FLONASE) 50 MCG/ACT nasal spray Place 2 sprays into both nostrils daily. 16 g 0   furosemide (LASIX) 40 MG tablet Take 40 mg by mouth daily as needed.     ibuprofen (ADVIL) 800 MG tablet Take 800 mg by mouth 3 (three) times daily as needed.     latanoprost (XALATAN) 0.005 % ophthalmic solution 1 drop at bedtime.     lisinopril-hydrochlorothiazide (PRINZIDE,ZESTORETIC) 20-25 MG per tablet Take 1 tablet by mouth daily.     lubiprostone (AMITIZA) 24 MCG capsule      naloxegol oxalate (MOVANTIK) 25 MG TABS tablet      OVER THE COUNTER MEDICATION Take 1 capsule by mouth 2 (two) times daily before a meal. Omega XL- for joints     pantoprazole (PROTONIX) 40 MG tablet Take 40 mg by mouth 2 (two) times daily.     TRELEGY ELLIPTA 200-62.5-25 MCG/ACT AEPB Inhale 1 puff into the lungs daily.     lidocaine (LIDODERM) 5 % Place 1 patch onto the skin daily. Remove & Discard patch within 12 hours or as directed by MD (Patient not taking: Reported on 08/04/2022) 5 patch 0   predniSONE (DELTASONE) 20 MG tablet Take by mouth.     tiZANidine (ZANAFLEX) 4 MG tablet  (Patient not taking: Reported on 08/04/2022)     Current Facility-Administered  Medications  Medication Dose Route Frequency Provider Last Rate Last Admin   0.9 %  sodium chloride infusion  500 mL Intravenous Once Kashden Deboy V, DO        Allergies as of 08/04/2022 - Review Complete 08/04/2022  Allergen Reaction Noted   Penicillins Nausea And Vomiting 08/29/2014    Family History  Problem Relation Age of Onset   Glaucoma Mother    Colon cancer Neg Hx    Breast cancer Neg Hx    Esophageal cancer Neg Hx    Stomach cancer Neg Hx    Rectal cancer Neg Hx     Social History   Socioeconomic History   Marital status: Widowed    Spouse name:  Not on file   Number of children: Not on file   Years of education: Not on file   Highest education level: Not on file  Occupational History   Not on file  Tobacco Use   Smoking status: Never   Smokeless tobacco: Never  Vaping Use   Vaping Use: Never used  Substance and Sexual Activity   Alcohol use: Yes    Comment: occ glass of wine   Drug use: No   Sexual activity: Not Currently  Other Topics Concern   Not on file  Social History Narrative   Not on file   Social Determinants of Health   Financial Resource Strain: Not on file  Food Insecurity: Not on file  Transportation Needs: Not on file  Physical Activity: Not on file  Stress: Not on file  Social Connections: Not on file  Intimate Partner Violence: Not on file    Physical Exam: Vital signs in last 24 hours: @BP$  (!) 144/74   Pulse 72   Temp 97.8 F (36.6 C) (Temporal)   Ht 5' 5"$  (1.651 m)   Wt 200 lb (90.7 kg)   SpO2 96%   BMI 33.28 kg/m  GEN: NAD EYE: Sclerae anicteric ENT: MMM CV: Non-tachycardic Pulm: CTA b/l GI: Soft, NT/ND NEURO:  Alert & Oriented x [place], DO [facility]   08/04/2022 2:01 PM

## 2022-08-04 NOTE — Progress Notes (Signed)
vss

## 2022-08-04 NOTE — Patient Instructions (Addendum)
Continue present medications. Await pathology results. Please read over handout about gastritis If symptoms persist, plan on further evaluation with modified barium swallow. Follow-up in the GI Clinic at appointment to be scheduled.  Dr. Vivia Ewing office nurse will call you to set that up.  Follow dilation diet- see handout                           YOU HAD AN ENDOSCOPIC PROCEDURE TODAY AT [facility]:   Refer to the procedure report that was given to you for any specific questions about what was found during the examination.  If the procedure report does not answer your questions, please call your gastroenterologist to clarify.  If you requested that your care partner not be given the details of your procedure findings, then the procedure report has been included in a sealed envelope for you to review at your convenience later.  YOU SHOULD EXPECT: Some feelings of bloating in the abdomen. Passage of more gas than usual.  Walking can help get rid of the air that was put into your GI tract during the procedure and reduce the bloating.   Please Note:  You might notice some irritation and congestion in your nose or some drainage.  This is from the oxygen used during your procedure.  There is no need for concern and it should clear up in a day or so.  SYMPTOMS TO REPORT IMMEDIATELY: Following upper endoscopy (EGD)  Vomiting of blood or coffee ground material  New chest pain or pain under the shoulder blades  Painful or persistently difficult swallowing  New shortness of breath  Fever of 100F or higher  Black, tarry-looking stools  For urgent or emergent issues, a gastroenterologist can be reached at any hour by calling [phone]. Do not use MyChart messaging for urgent concerns.    DIET:  follow dilation diet!!.  Drink plenty of fluids but you should avoid alcoholic beverages for 24 hours.  ACTIVITY:  You should plan to take it easy for the rest of today and you  should NOT DRIVE or use heavy machinery until tomorrow (because of the sedation medicines used during the test).    FOLLOW UP: Our staff will call the number listed on your records the next business day following your procedure.  We will call around 7:15- 8:00 am to check on you and address any questions or concerns that you may have regarding the information given to you following your procedure. If we do not reach you, we will leave a message.     If any biopsies were taken you will be contacted by phone or by letter within the next 1-3 weeks.  Please call us at [phone] if you have not heard about the biopsies in 3 weeks.    SIGNATURES/CONFIDENTIALITY: You and/or your care partner have signed paperwork which will be entered into your electronic medical record.  These signatures attest to the fact that that the information above on your After Visit Summary has been reviewed and is understood.  Full responsibility of the confidentiality of this discharge information lies with you and/or your care-partner.

## 2022-08-04 NOTE — Op Note (Signed)
[facility] Patient Name: [name] First Procedure Date: 08/04/2022 1:59 PM MRN: [id number] Endoscopist: Gerrit Heck , MD, [id number] Age: 67 Referring MD:  Date of Birth: [birth year] Gender: Female Account #: [id number] Procedure:                Upper GI endoscopy Indications:              Dysphagia, Suspected esophageal reflux, Weight loss Medicines:                Monitored Anesthesia Care Procedure:                Pre-Anesthesia Assessment:                           - Prior to the procedure, a History and Physical                            was performed, and patient medications and                            allergies were reviewed. The patient's tolerance of                            previous anesthesia was also reviewed. The risks                            and benefits of the procedure and the sedation                            options and risks were discussed with the patient.                            All questions were answered, and informed consent                            was obtained. Prior Anticoagulants: The patient has                            taken no anticoagulant or antiplatelet agents. ASA                            Grade Assessment: III - A patient with severe                            systemic disease. After reviewing the risks and                            benefits, the patient was deemed in satisfactory                            condition to undergo the procedure.                           After obtaining informed consent, the endoscope was  passed under direct vision. Throughout the                            procedure, the patient's blood pressure, pulse, and                            oxygen saturations were monitored continuously. The                            Olympus scope [id number] was introduced through the                            mouth, and advanced to the second part of duodenum.                             The upper GI endoscopy was accomplished without                            difficulty. The patient tolerated the procedure                            well. Scope In: Scope Out: Findings:                 The examined esophagus was normal. A guidewire was                            placed and the scope was withdrawn. Dilation was                            performed with a Savary dilator with mild                            resistance at 17 mm. The dilation site was examined                            following endoscope reinsertion and showed no                            bleeding, mucosal tear or perforation. Biopsies                            were obtained from the proximal and distal                            esophagus with cold forceps for histology of                            suspected eosinophilic esophagitis. Estimated blood                            loss was minimal.                           The Z-line was regular and  was found 39 cm from the                            incisors.                           The gastroesophageal flap valve was visualized                            endoscopically and classified as Hill Grade III                            (minimal fold, loose to endoscope, hiatal hernia                            likely).                           Segmental mild inflammation characterized by                            congestion (edema) and erythema was found in the                            gastric body and in the gastric antrum. Biopsies                            were taken with a cold forceps for histology and                            Helicobacter pylori testing. Estimated blood loss                            was minimal.                           The examined duodenum was normal. Complications:            No immediate complications. Estimated Blood Loss:     Estimated blood loss was minimal. Impression:               - Normal esophagus. Dilated with  17 mm Savary                            dilator, then biopsies were taken with a cold                            forceps for evaluation of eosinophilic esophagitis.                           - Z-line regular, 39 cm from the incisors.                           - Gastroesophageal flap valve classified as Hill  Grade III (minimal fold, loose to endoscope, hiatal                            hernia likely).                           - Mild non-ulcer gastritis. Biopsied.                           - Normal examined duodenum. Recommendation:           - Patient has a contact number available for                            emergencies. The signs and symptoms of potential                            delayed complications were discussed with the                            patient. Return to normal activities tomorrow.                            Written discharge instructions were provided to the                            patient.                           - Resume previous diet.                           - Continue present medications.                           - Await pathology results.                           - If symptoms persist, plan on further evaluation                            with modified barium swallow.                           - Follow-up in the GI Clinic at appointment to be                            scheduled. Gerrit Heck, MD 08/04/2022 2:31:32 PM

## 2022-08-05 ENCOUNTER — Telehealth: Payer: Self-pay | Admitting: *Deleted

## 2022-08-05 NOTE — Telephone Encounter (Signed)
No answer for post procedure call Back. Left VM.

## 2022-08-15 ENCOUNTER — Other Ambulatory Visit: Payer: Self-pay

## 2022-08-15 DIAGNOSIS — B9681 Helicobacter pylori [H. pylori] as the cause of diseases classified elsewhere: Secondary | ICD-10-CM

## 2022-08-15 MED ORDER — BISMUTH SUBSALICYLATE 262 MG PO TABS: 524.0000 mg | ORAL_TABLET | Freq: Four times a day (QID) | ORAL | 0 refills | Status: AC

## 2022-08-15 MED ORDER — METRONIDAZOLE 250 MG PO TABS: 250.0000 mg | ORAL_TABLET | Freq: Four times a day (QID) | ORAL | 0 refills | Status: AC

## 2022-08-15 MED ORDER — DOXYCYCLINE HYCLATE 100 MG PO CAPS: 100.0000 mg | ORAL_CAPSULE | Freq: Two times a day (BID) | ORAL | 0 refills | Status: AC

## 2022-09-29 ENCOUNTER — Telehealth: Payer: Self-pay

## 2022-09-29 DIAGNOSIS — B9681 Helicobacter pylori [H. pylori] as the cause of diseases classified elsewhere: Secondary | ICD-10-CM

## 2022-09-29 NOTE — Telephone Encounter (Signed)
-----   Message from Missy Sabins, RN sent at 08/15/2022  4:39 PM EST ----- Regarding: DIATHERIX - H. Pylori DIATHERIX - H. Pylori       Dx: H. Pylori gastritis

## 2022-09-30 NOTE — Telephone Encounter (Signed)
Called and spoke with patient to remind her that she is due for [employer] H. Pylori stool test at this time. Pt will stop by the 2nd floor next week to pick up [employer] H. Pylori kit and instructions. Patient verbalized understanding and had no concerns at the end of the call.   Misc order in epic.  Letter with instructions printed and placed with [employer] H. Pylori stool kit at 2nd floor receptionist desk. Demographics and insurance information attached with order.

## 2022-10-14 NOTE — Telephone Encounter (Signed)
LVM to patient to let her know that [employer] stool kit she submitted came invalid. Test was not performed patient didn't put the sticker which had her name on it in the specimen tube. Patient has to come to the office to pick up new stool kit and submit again.

## 2022-10-20 ENCOUNTER — Telehealth: Payer: Self-pay

## 2022-10-20 NOTE — Telephone Encounter (Signed)
Called patient and LVM to inform her that stool sample she submitted test was not performed by [facility]. Specimen tube was submitted with out patient identifier. Patient was supposed to place a label on the tube prior collecting stool sample. Patient need to come to our office to collect the kit.

## 2022-10-21 NOTE — Telephone Encounter (Signed)
LVM again to return the call.

## 2022-10-24 NOTE — Telephone Encounter (Signed)
Called patient and LVM again to return the call.

## 2022-10-25 NOTE — Telephone Encounter (Signed)
Patient called to return phone call. Requesting a call back. Please advise, thank you.

## 2022-10-25 NOTE — Telephone Encounter (Signed)
Called patient back. Patient is coming tomorrow after 3 pm to pick up [facility] stool kit.

## 2022-11-01 LAB — POCT H PYLORI SCREEN: H Pylori Screen, POC: POSITIVE — AB

## 2022-11-02 ENCOUNTER — Other Ambulatory Visit (INDEPENDENT_AMBULATORY_CARE_PROVIDER_SITE_OTHER): Payer: 59 | Admitting: *Deleted

## 2022-11-02 DIAGNOSIS — K2970 Gastritis, unspecified, without bleeding: Secondary | ICD-10-CM | POA: Diagnosis not present

## 2022-11-02 DIAGNOSIS — B9681 Helicobacter pylori [H. pylori] as the cause of diseases classified elsewhere: Secondary | ICD-10-CM | POA: Diagnosis not present

## 2022-11-04 ENCOUNTER — Telehealth: Payer: Self-pay

## 2022-11-04 DIAGNOSIS — B9681 Helicobacter pylori [H. pylori] as the cause of diseases classified elsewhere: Secondary | ICD-10-CM

## 2022-11-04 NOTE — Telephone Encounter (Signed)
-----   Message from Madison V, DO sent at 11/03/2022 12:36 PM EDT ----- H. pylori is still positive despite recent treatment with quadruple therapy.  Can you please confirm that the patient was able to take and complete the antibiotics as prescribed.  If so, will plan on Clarithromycin-based concomitant therapy:  -Prilosec 20 mg PO BID x14 days -Clarithromycin 500 mg PO BID x14 days -Amoxicillin 1 g PO BID x14 days -Metronidazole 500 mg PO BID x14 days  Can recheck [facility] panel 4 weeks after completion of therapy.

## 2022-11-04 NOTE — Telephone Encounter (Signed)
Spoke with patient. Stated she took all medications that was prescribed last time. Provided result and recommendations.  Patient is aware that she will need to take medications for 14 days along with current dose of Protonix. Advised patient we will order a stool test in about 6 weeks to ensure bacteria has been eradicated. Patient verbalized understanding.

## 2022-11-08 MED ORDER — NITAZOXANIDE 500 MG PO TABS: 500.0000 mg | ORAL_TABLET | Freq: Two times a day (BID) | ORAL | 0 refills | Status: AC

## 2022-11-08 MED ORDER — DOXYCYCLINE HYCLATE 100 MG PO TABS: 100.0000 mg | ORAL_TABLET | Freq: Two times a day (BID) | ORAL | 0 refills | Status: AC

## 2022-11-08 MED ORDER — LEVOFLOXACIN 500 MG PO TABS: 500.0000 mg | ORAL_TABLET | Freq: Every day | ORAL | 0 refills | Status: AC

## 2022-11-08 NOTE — Addendum Note (Signed)
Addended by: Coletta Memos on: 11/08/2022 08:49 AM   Modules accepted: Orders

## 2022-11-08 NOTE — Telephone Encounter (Signed)
Spoke with patient. Patient understands that she needs to take 10 days course of medications. Patient is advised to take current dose of Protonix. Advised patient we will order a stool test in about 6 weeks to ensure bacteria has been eradicated. Patient verbalized understanding. Patient verbalized understanding at the end of the call.  [facility] is ordered.

## 2022-11-08 NOTE — Telephone Encounter (Signed)
Cirigliano, Verlin Dike, DO  [place] Coldren, Shindler U, CMA Due to PCN allergy, let's trial a course of LOAD therapy with the following: - Levofloxacin 500 mg daily x 10 days - Nitazoxanide 500 mg twice daily x 10 days - Doxycycline 100 mg twice daily x 10 days - Omeprazole 20 mg twice daily x 10 days

## 2022-11-08 NOTE — Telephone Encounter (Signed)
Medications sent to patient's pharmacy. LVM to return the call.

## 2022-11-08 NOTE — Addendum Note (Signed)
Addended by: Coletta Memos on: 11/08/2022 01:14 PM   Modules accepted: Orders

## 2022-12-16 ENCOUNTER — Other Ambulatory Visit: Payer: Self-pay | Admitting: Gastroenterology

## 2022-12-20 ENCOUNTER — Telehealth: Payer: Self-pay

## 2022-12-20 NOTE — Telephone Encounter (Signed)
Called and spoke with patient to remind her that she is due for [facility] H. Pylori stool test at this time. Pt will stop by the 2nd floor tomorrow to pick up [facility] H. Pylori kit and instructions. Patient verbalized understanding and had no concerns at the end of the call.   Misc order in epic.  Letter with instructions printed and placed with [facility] H. Pylori stool kit at 2nd floor receptionist desk. Demographics and insurance information attached with order.

## 2022-12-22 ENCOUNTER — Ambulatory Visit
Admission: RE | Admit: 2022-12-22 | Discharge: 2022-12-22 | Disposition: A | Discharge status: Home / Self Care | Payer: 59 | Source: Ambulatory Visit | Attending: Internal Medicine | Admitting: Internal Medicine

## 2022-12-22 DIAGNOSIS — E2839 Other primary ovarian failure: Secondary | ICD-10-CM

## 2023-01-04 ENCOUNTER — Telehealth: Payer: Self-pay | Admitting: Gastroenterology

## 2023-01-04 DIAGNOSIS — B9681 Helicobacter pylori [H. pylori] as the cause of diseases classified elsewhere: Secondary | ICD-10-CM

## 2023-01-04 NOTE — Telephone Encounter (Signed)
Penicillin is listed as allergy reaction for this patient. Please advise. Thank you!

## 2023-01-04 NOTE — Telephone Encounter (Signed)
Unfortunately the [facility] panel was again positive, consistent with continued H. pylori infection. Clarithromycin resistance was not detected.  This represents unsuccessful treatment with quadruple therapy then clarithromycin-based concomitant therapy.    Will try rifabutin based triple salvage therapy with the following regimen: - Rifabutin 300 mg daily x 14 days - Amoxicillin 1 g 3 times daily x 14 days - Protonix 40 mg twice daily x 14 days   Repeat [facility] panel 4 weeks after completion of therapy.  If [facility] panel still positive, will refer to ID clinic for persistent H. pylori despite 3 courses of antibiotics.

## 2023-01-04 NOTE — Addendum Note (Signed)
Addended by: Coletta Memos on: 01/04/2023 04:54 PM   Modules accepted: Orders

## 2023-01-17 ENCOUNTER — Encounter: Payer: Self-pay | Admitting: Gastroenterology

## 2023-01-19 NOTE — Telephone Encounter (Signed)
Spoke with the patient. Provided result and recommendations outlined by Dr. Barron Alvine. Made her aware that we will fax a referral to the allergy clinic, and [facility] for further evaluation. Informed her that she will get a call from their office to schedule an appointment in about 2 weeks or so. Patient verbalized understanding.  Per Patient request, [facility] result is faxed to Dr. Albertina Parr office.

## 2023-01-19 NOTE — Addendum Note (Signed)
Addended by: Coletta Memos on: 01/19/2023 02:55 PM   Modules accepted: Orders

## 2023-02-08 ENCOUNTER — Telehealth: Payer: Self-pay | Admitting: Gastroenterology

## 2023-02-08 NOTE — Telephone Encounter (Signed)
Inbound call from [facility] needing clarification for a referral they received from [place]. Requesting a call back at [phone]. Please advise, thank you.

## 2023-02-09 NOTE — Telephone Encounter (Signed)
Called back the office at [phone]. Spoke with Cassandra that referral is for EGD with biopsy, and biopsy is for culture and sensitivity test to determine what antibiotic will treat recurrent H.pylori. No further questions regarding referral. She stated they will contact the patient for consult appointment.

## 2023-03-03 ENCOUNTER — Ambulatory Visit: Payer: 59 | Admitting: Internal Medicine

## 2023-03-21 ENCOUNTER — Telehealth: Payer: Self-pay | Admitting: Allergy & Immunology

## 2023-03-21 ENCOUNTER — Other Ambulatory Visit: Payer: Self-pay

## 2023-03-21 ENCOUNTER — Encounter: Payer: Self-pay | Admitting: Allergy & Immunology

## 2023-03-21 ENCOUNTER — Ambulatory Visit (INDEPENDENT_AMBULATORY_CARE_PROVIDER_SITE_OTHER): Payer: 59 | Admitting: Allergy & Immunology

## 2023-03-21 VITALS — BP 124/72 | HR 74 | Temp 98.0°F | Resp 20 | Ht 63.0 in | Wt 205.2 lb

## 2023-03-21 DIAGNOSIS — J4540 Moderate persistent asthma, uncomplicated: Secondary | ICD-10-CM

## 2023-03-21 DIAGNOSIS — T50905D Adverse effect of unspecified drugs, medicaments and biological substances, subsequent encounter: Secondary | ICD-10-CM

## 2023-03-21 DIAGNOSIS — K219 Gastro-esophageal reflux disease without esophagitis: Secondary | ICD-10-CM | POA: Diagnosis not present

## 2023-03-21 DIAGNOSIS — Z880 Allergy status to penicillin: Secondary | ICD-10-CM | POA: Diagnosis not present

## 2023-03-21 DIAGNOSIS — J3089 Other allergic rhinitis: Secondary | ICD-10-CM | POA: Diagnosis not present

## 2023-03-21 MED ORDER — CETIRIZINE HCL 10 MG PO TABS: 10.0000 mg | ORAL_TABLET | Freq: Every day | ORAL | 1 refills | Status: DC

## 2023-03-21 NOTE — Progress Notes (Signed)
NEW PATIENT  Date of Service/Encounter:  03/21/23  Consult requested by: Fleet Contras, MD   Assessment:   Perennial allergic rhinitis (dust mites) - confirming with blood work  Moderate persistent asthma, uncomplicated - on a rather interesting nontraditional regimen  Adverse effect of drug (penicillin) - planning for testing and challenge in the future  Plan/Recommendations:    1. Perennial allergic rhinitis - Testing today showed: dust mites - Copy of test results provided.  - Avoidance measures provided. - Start taking: Zyrtec (cetirizine) 10mg  tablet once daily - You can use an extra dose of the antihistamine, if needed, for breakthrough symptoms.  - Consider nasal saline rinses 1-2 times daily to remove allergens from the nasal cavities as well as help with mucous clearance (this is especially helpful to do before the nasal sprays are given)  2. Moderate persistent asthma, uncomplicated - Lung testing looked fairly good. - We are not going to make any changes since you are under good control.  - Daily controller medication(s): Trelegy 200/62.5/25 one puff once daily - Prior to physical activity: Breo  10-15 minutes before physical activity. - Rescue medications: Breo 1-2 times daily as needed.   3. Adverse effect of drug - We are going to get penicillin testing done in the next 1-2 weeks. - We are going to get some labs to rule out weird causes of allergic reaction. - If the testing is negative, we are going to schedule a penicillin challenge to get this off of your allergy list and allow your GI doctor to treat your Helicobacter infection.  4. Return in about 1 week (around 03/28/2023) for penicillin testing. You can have the follow up appointment with Dr. Dellis Anes or a Nurse Practicioner (our Nurse Practitioners are excellent and always have Physician oversight!).    This note in its entirety was forwarded to the Provider who requested this  consultation.  Subjective:   [name] is a 67 y.o. female presenting today for evaluation of No chief complaint on file.   [name] has a history of the following: Patient Active Problem List   Diagnosis Date Noted   Dysphagia 08/03/2022    History obtained from: chart review and patient.  Discussed the use of AI scribe software for clinical note transcription with the patient and/or guardian, who gave verbal consent to proceed.  Bernell List was referred by Fleet Contras, MD.     [name] is a 67 y.o. female presenting for an evaluation of environmental allergies as well as a drug allergy .  Discussed the use of AI scribe software for clinical note transcription with the patient, who gave verbal consent to proceed.  [name], a patient with a known allergy to penicillin, presents with concerns about potential unknown allergies. The patient's penicillin allergy manifests as vomiting shortly after ingestion, with the last known exposure in the early 2000s. She is unsure if the allergy also causes skin itching.  She is not sure how this was treated.  She did not go to the hospital.  She did not get an epinephrine.  She also mentions that she wants to find out what other medication she is allergic to, but she has never had an allergic reaction to any other medications that she knows of.  It looks like she was evaluated by Dr. Barron Alvine in February 2024 for dysphagia.  At the time, she had a 4 to 79-month history of dysphagia which have been worsening.  She was having issues  with swallowing food, water, and pills.  She had lost a lot of weight as well.  She had an endoscopy that ended up confirming Helicobacter pylori.  She was treated with metronidazole as well as doxycycline for 14 days each.  However, she still had a positive test despite this.  She was started on clarithromycin, amoxicillin, and metronidazole in May 2024.  However, she cannot take it because of a history of a  penicillin allergy.  She was treated with levofloxacin, nitazozanide, and doxycycline.  However, she was still positive. She is currently on Protonix twice daily for reflux symptoms.   Asthma/Respiratory Symptom History: In addition to these concerns, she has a history of asthma, managed with Breo and Trelegy.  She describes her Trelegy as her maintenance medication with Breo added during flares.  This is all managed by her primary care provider.  She has never seen pulmonology.   Allergic Rhinitis Symptom History: She reports year-round allergies that are worse in the spring.  She has lived in the [place] area for 22 years and reports that her allergies have worsened over time. She denies any known food allergies.   Skin Symptom History: She also reports chronic skin itching, for which she has been prescribed creams and pills. The itching sometimes results in hives or small knots, but she is unsure of the cause.  She does have some hyperpigmented lesions from all of the itching.  She also suffers from chronic pain, managed with as-needed prednisone and regular steroid injections.  Otherwise, there is no history of other atopic diseases, including asthma, food allergies, drug allergies, stinging insect allergies, or contact dermatitis. There is no significant infectious history. Vaccinations are up to date.    Past Medical History: Patient Active Problem List   Diagnosis Date Noted   Dysphagia 08/03/2022    Medication List:  Allergies as of 03/21/2023       Reactions   Penicillins Nausea And Vomiting   tremors        Medication List        Accurate as of March 21, 2023  5:18 PM. If you have any questions, ask your nurse or doctor.          Amitiza 24 MCG capsule Generic drug: lubiprostone   amLODipine 10 MG tablet Commonly known as: NORVASC Take 10 mg by mouth daily.   ASPIRIN 81 PO Take 81 mg by mouth daily in the afternoon.   atorvastatin 20 MG tablet Commonly  known as: LIPITOR Take 20 mg by mouth at bedtime.   Breo Ellipta 200-25 MCG/ACT Aepb Generic drug: fluticasone furoate-vilanterol 1 puff daily.   cetirizine 10 MG tablet Commonly known as: ZYRTEC Take 1 tablet (10 mg total) by mouth daily. What changed: See the new instructions. Changed by: Alfonse Spruce   Cholecalciferol 1.25 MG (50000 UT) capsule Take 1 capsule by mouth once a week.   diclofenac Sodium 1 % Gel Commonly known as: VOLTAREN SMARTSIG:4 Gram(s) Topical 4 Times Daily PRN   dorzolamide-timolol 2-0.5 % ophthalmic solution Commonly known as: COSOPT SMARTSIG:In Eye(s)   famotidine 20 MG tablet Commonly known as: PEPCID Take 20 mg by mouth 2 (two) times daily.   fluticasone 50 MCG/ACT nasal spray Commonly known as: FLONASE Place 2 sprays into both nostrils daily.   furosemide 40 MG tablet Commonly known as: LASIX Take 40 mg by mouth daily as needed.   ibuprofen 800 MG tablet Commonly known as: ADVIL Take 800 mg by mouth 3 (three) times  daily as needed.   latanoprost 0.005 % ophthalmic solution Commonly known as: XALATAN 1 drop at bedtime.   lidocaine 5 % Commonly known as: Lidoderm Place 1 patch onto the skin daily. Remove & Discard patch within 12 hours or as directed by MD   lisinopril-hydrochlorothiazide 20-25 MG tablet Commonly known as: ZESTORETIC Take 1 tablet by mouth daily.   Movantik 25 MG Tabs tablet Generic drug: naloxegol oxalate   OVER THE COUNTER MEDICATION Take 1 capsule by mouth 2 (two) times daily before a meal. Omega XL- for joints   pantoprazole 40 MG tablet Commonly known as: PROTONIX Take 40 mg by mouth 2 (two) times daily.   predniSONE 20 MG tablet Commonly known as: DELTASONE Take by mouth.   tiZANidine 4 MG tablet Commonly known as: ZANAFLEX   Trelegy Ellipta 200-62.5-25 MCG/ACT Aepb Generic drug: Fluticasone-Umeclidin-Vilant Inhale 1 puff into the lungs daily.        Birth History:  non-contributory  Developmental History: non-contributory  Past Surgical History: Past Surgical History:  Procedure Laterality Date   BILATERAL CARPAL TUNNEL RELEASE  2021   COLONOSCOPY     LUMBAR DISC SURGERY     UPPER GASTROINTESTINAL ENDOSCOPY  08/04/2022     Family History: Family History  Problem Relation Age of Onset   Glaucoma Mother    Colon cancer Neg Hx    Breast cancer Neg Hx    Esophageal cancer Neg Hx    Stomach cancer Neg Hx    Rectal cancer Neg Hx      Social History: Zivah lives at home with in a house of unknown age.  There is gas heating and central cooling.  There is a dog inside and outside of the home.  There are no dust mite covers on the bedding.  There is no tobacco exposure.  She is not exposed to fumes, chemicals, or dust.  She does have a HEPA filter.  [place] interstate or industrial area.   Review of systems otherwise negative other than that mentioned in the HPI.    Objective:   Blood pressure 124/72, pulse 74, temperature 98 F (36.7 C), temperature source Temporal, resp. rate 20, height 5\' 3"  (1.6 m), weight 205 lb 3.2 oz (93.1 kg), SpO2 98%. Body mass index is 36.35 kg/m.     Physical Exam Vitals reviewed.  Constitutional:      Appearance: She is well-developed.     Comments: Very talkative.  Cooperative.  HENT:     Head: Normocephalic and atraumatic.     Right Ear: Tympanic membrane, ear canal and external ear normal. No drainage, swelling or tenderness. Tympanic membrane is not injected, scarred, erythematous, retracted or bulging.     Left Ear: Tympanic membrane, ear canal and external ear normal. No drainage, swelling or tenderness. Tympanic membrane is not injected, scarred, erythematous, retracted or bulging.     Nose: No nasal deformity, septal deviation, mucosal edema or rhinorrhea.     Right Turbinates: Enlarged, swollen and pale.     Left Turbinates: Enlarged, swollen and pale.     Right Sinus: No  maxillary sinus tenderness or frontal sinus tenderness.     Left Sinus: No maxillary sinus tenderness or frontal sinus tenderness.     Mouth/Throat:     Mouth: Mucous membranes are not pale and not dry.     Pharynx: Uvula midline.  Eyes:     General: Lids are normal. Allergic shiner present.  Right eye: No discharge.        Left eye: No discharge.     Conjunctiva/sclera: Conjunctivae normal.     Right eye: Right conjunctiva is not injected. No chemosis.    Left eye: Left conjunctiva is not injected. No chemosis.    Pupils: Pupils are equal, round, and reactive to light.  Cardiovascular:     Rate and Rhythm: Normal rate and regular rhythm.     Heart sounds: Normal heart sounds.  Pulmonary:     Effort: Pulmonary effort is normal. No tachypnea, accessory muscle usage or respiratory distress.     Breath sounds: Normal breath sounds. No wheezing, rhonchi or rales.     Comments: Moving air well in all lung fields.  No increased work of breathing. Chest:     Chest wall: No tenderness.  Abdominal:     Tenderness: There is no abdominal tenderness. There is no guarding or rebound.  Lymphadenopathy:     Head:     Right side of head: No submandibular, tonsillar or occipital adenopathy.     Left side of head: No submandibular, tonsillar or occipital adenopathy.     Cervical: No cervical adenopathy.  Skin:    General: Skin is warm.     Capillary Refill: Capillary refill takes less than 2 seconds.     Coloration: Skin is not pale.     Findings: No abrasion, erythema, petechiae or rash. Rash is not papular, urticarial or vesicular.     Comments: She does have some hyperpigmented lesions on her bilateral arms.  Neurological:     Mental Status: She is alert.  Psychiatric:        Behavior: Behavior is cooperative.      Diagnostic studies:    Spirometry: results normal (FEV1: 1.89/97%, FVC: 2.17/88%, FEV1/FVC: 87%).    Spirometry consistent with normal pattern.   Allergy Studies:  none   Airborne Adult Perc - 03/21/23 1400     Time Antigen Placed 1455    Allergen Manufacturer Waynette Buttery    Location Back    Number of Test 55    1. Control-Buffer 50% Glycerol Negative    2. Control-Histamine 2+    3. Bahia Negative    4. French Southern [place] Negative    5. Johnson Negative    6. Kentucky Blue Negative    7. Meadow Fescue Negative    8. Perennial Rye Negative    9. Timothy Negative    10. Ragweed Mix Negative    11. Cocklebur Negative    12. Plantain,  English Negative    13. Baccharis Negative    14. Dog Fennel Negative    15. Russian Thistle Negative    16. Lamb's Quarters Negative    17. Sheep Sorrell Negative    18. Rough Pigweed Negative    19. Marsh Elder, Rough Negative    20. Mugwort, Common Negative    21. Box, Elder Negative    22. Cedar, red Negative    23. Sweet Gum Negative    24. Pecan Pollen Negative    25. Pine Mix Negative    26. Walnut, Black Pollen Negative    27. Red Mulberry Negative    28. Ash Mix Negative    29. Birch Mix Negative    30. Beech American Negative    31. Cottonwood, [place] Negative    32. Hickory, White Negative    33. Maple Mix Negative    34. Oak, [place] Mix Negative    35. Sycamore [place]  Negative    36. Alternaria Alternata Negative    37. Cladosporium Herbarum Negative    38. Aspergillus Mix Negative    39. Penicillium Mix Negative    40. Bipolaris Sorokiniana (Helminthosporium) Negative    41. Drechslera Spicifera (Curvularia) Negative    42. Mucor Plumbeus Negative    43. Fusarium Moniliforme Negative    44. Aureobasidium Pullulans (pullulara) Negative    45. Rhizopus Oryzae Negative    46. Botrytis Cinera Negative    47. Epicoccum Nigrum Negative    48. Phoma Betae Negative    49. Dust Mite Mix 3+    50. Cat Hair 10,000 BAU/ml Negative    51.  Dog Epithelia Negative    52. Mixed Feathers Negative    53. Horse Epithelia Negative    54. Cockroach, German Negative    55. Tobacco Leaf Negative              13 Food Perc - 03/21/23 1400       Test Information   Time Antigen Placed 1456    Allergen Manufacturer Waynette Buttery    Location Back    Number of allergen test 13      Food   1. Peanut Negative    2. Soybean Negative    3. Wheat Negative    4. Sesame Negative    5. Milk, Cow Negative    6. Casein Negative    7. Egg White, Chicken Negative    8. Shellfish Mix Negative    9. Fish Mix Negative    10. Cashew Negative    11. Walnut Food Negative    12. Almond Negative    13. Hazelnut Negative             Allergy testing results were read and interpreted by myself, documented by clinical staff.         Malachi Bonds, MD [facility]

## 2023-03-21 NOTE — Addendum Note (Signed)
Addended by: Philipp Deputy on: 03/21/2023 06:08 PM   Modules accepted: Orders

## 2023-03-21 NOTE — Patient Instructions (Addendum)
1. Perennial allergic rhinitis - Testing today showed: dust mites - Copy of test results provided.  - Avoidance measures provided. - Start taking: Zyrtec (cetirizine) 10mg  tablet once daily - You can use an extra dose of the antihistamine, if needed, for breakthrough symptoms.  - Consider nasal saline rinses 1-2 times daily to remove allergens from the nasal cavities as well as help with mucous clearance (this is especially helpful to do before the nasal sprays are given)  2. Moderate persistent asthma, uncomplicated - Lung testing looked fairly good. - We are not going to make any changes since you are under good control.  - Daily controller medication(s): Trelegy 200/62.5/25 one puff once daily - Prior to physical activity: Breo  10-15 minutes before physical activity. - Rescue medications: Breo 1-2 times daily as needed.   3. Adverse effect of drug - We are going to get penicillin testing done in the next 1-2 weeks. - We are going to get some labs to rule out weird causes of allergic reaction. - If the testing is negative, we are going to schedule a penicillin challenge to get this off of your allergy list and allow your GI doctor to treat your Helicobacter infection.  4. Return in about 1 week (around 03/28/2023) for penicillin testing. You can have the follow up appointment with Dr. Dellis Anes or a Nurse Practicioner (our Nurse Practitioners are excellent and always have Physician oversight!).    Please inform us of any Emergency Department visits, hospitalizations, or changes in symptoms. Call us before going to the ED for breathing or allergy symptoms since we might be able to fit you in for a sick visit. Feel free to contact us anytime with any questions, problems, or concerns.  It was a pleasure to meet you today!  Websites that have reliable patient information: 1. American Academy of Asthma, Allergy, and Immunology: www.aaaai.org 2. Food Allergy Research and Education (FARE):  foodallergy.org 3. Mothers of Asthmatics: http://www.asthmacommunitynetwork.org 4. American College of Allergy, Asthma, and Immunology: www.acaai.org   COVID-19 Vaccine Information can be found at: [email or web address] For questions related to vaccine distribution or appointments, please email [email or web address] or call [phone].     "Like" [place] on Facebook and Instagram for our latest updates!      A healthy democracy works best when [employer] participate! Make sure you are registered to vote! If you have moved or changed any of your contact information, you will need to get this updated before voting! Scan the QR codes below to learn more!        Airborne Adult Perc - 03/21/23 1400     Time Antigen Placed 1455    Allergen Manufacturer Waynette Buttery    Location Back    Number of Test 55    1. Control-Buffer 50% Glycerol Negative    2. Control-Histamine 2+    3. Bahia Negative    4. French Southern [place] Negative    5. Johnson Negative    6. [place] Blue Negative    7. Meadow Fescue Negative    8. Perennial Rye Negative    9. Timothy Negative    10. Ragweed Mix Negative    11. Cocklebur Negative    12. Plantain,  English Negative    13. Baccharis Negative    14. Dog Fennel Negative    15. Russian Thistle Negative    16. Lamb's Quarters Negative    17. Sheep Sorrell Negative    18. Rough Pigweed Negative    19. Michail Jewels Elder, Rough Negative  20. Mugwort, Common Negative    21. Box, Elder Negative    22. Cedar, red Negative    23. Sweet Gum Negative    24. Pecan Pollen Negative    25. Pine Mix Negative    26. Walnut, Black Pollen Negative    27. Red Mulberry Negative    28. Ash Mix Negative    29. Birch Mix Negative    30. Beech American Negative    31. Cottonwood, Guinea-Bissau Negative    32. Hickory, White Negative    33. Maple Mix Negative    34. Oak, Guinea[place] Mix Negative    35. Sycamore Eastern Negative    36.  Alternaria Alternata Negative    37. Cladosporium Herbarum Negative    38. Aspergillus Mix Negative    39. Penicillium Mix Negative    40. Bipolaris Sorokiniana (Helminthosporium) Negative    41. Drechslera Spicifera (Curvularia) Negative    42. Mucor Plumbeus Negative    43. Fusarium Moniliforme Negative    44. Aureobasidium Pullulans (pullulara) Negative    45. Rhizopus Oryzae Negative    46. Botrytis Cinera Negative    47. Epicoccum Nigrum Negative    48. Phoma Betae Negative    49. Dust Mite Mix 3+    50. Cat Hair 10,000 BAU/ml Negative    51.  Dog Epithelia Negative    52. Mixed Feathers Negative    53. Horse Epithelia Negative    54. Cockroach, German Negative    55. Tobacco Leaf Negative             13 Food Perc - 03/21/23 1400       Test Information   Time Antigen Placed 1456    Allergen Manufacturer Waynette Buttery    Location Back    Number of allergen test 13      Food   1. Peanut Negative    2. Soybean Negative    3. Wheat Negative    4. Sesame Negative    5. Milk, Cow Negative    6. Casein Negative    7. Egg White, Chicken Negative    8. Shellfish Mix Negative    9. Fish Mix Negative    10. Cashew Negative    11. Walnut Food Negative    12. Almond Negative    13. Hazelnut Negative             Control of Dust Mite Allergen    Dust mites play a major role in allergic asthma and rhinitis.  They occur in environments with high humidity wherever human skin is found.  Dust mites absorb humidity from the atmosphere (ie, they do not drink) and feed on organic matter (including shed human and animal skin).  Dust mites are a microscopic type of insect that you cannot see with the naked eye.  High levels of dust mites have been detected from mattresses, pillows, carpets, upholstered furniture, bed covers, clothes, soft toys and any woven material.  The principal allergen of the dust mite is found in its feces.  A gram of dust may contain 1,000 mites and 250,000  fecal particles.  Mite antigen is easily measured in the air during house cleaning activities.  Dust mites do not bite and do not cause harm to humans, other than by triggering allergies/asthma.    Ways to decrease your exposure to dust mites in your home:  Encase mattresses, box springs and pillows with a mite-impermeable barrier or cover   Wash sheets, blankets and drapes  weekly in hot water (130 F) with detergent and dry them in a dryer on the hot setting.  Have the room cleaned frequently with a vacuum cleaner and a damp dust-mop.  For carpeting or rugs, vacuuming with a vacuum cleaner equipped with a high-efficiency particulate air (HEPA) filter.  The dust mite allergic individual should not be in a room which is being cleaned and should wait 1 hour after cleaning before going into the room. Do not sleep on upholstered furniture (eg, couches).   If possible removing carpeting, upholstered furniture and drapery from the home is ideal.  Horizontal blinds should be eliminated in the rooms where the person spends the most time (bedroom, study, television room).  Washable vinyl, roller-type shades are optimal. Remove all non-washable stuffed toys from the bedroom.  Wash stuffed toys weekly like sheets and blankets above.   Reduce indoor humidity to less than 50%.  Inexpensive humidity monitors can be purchased at most hardware stores.  Do not use a humidifier as can make the problem worse and are not recommended.

## 2023-03-21 NOTE — Telephone Encounter (Signed)
Patient has a Penicillin Challenge scheduled with Thurston Hole on October 31st at 1:30pm.  Please mail patient paperwork regarding the challenge.  Best Contact: [phone]

## 2023-03-22 ENCOUNTER — Other Ambulatory Visit: Payer: Self-pay

## 2023-03-22 MED ORDER — AMOXICILLIN 400 MG/5ML PO SUSR: 400.0000 mg | ORAL | 0 refills | Status: AC

## 2023-03-22 NOTE — Telephone Encounter (Signed)
Left detailed voice message on phone about medication being sent into the pharmacy for the challenge and to not pick it up until the day before the challenge. Protocol has ben mailed to address on file.

## 2023-03-23 ENCOUNTER — Other Ambulatory Visit: Payer: Self-pay | Admitting: Internal Medicine

## 2023-03-23 DIAGNOSIS — Z0000 Encounter for general adult medical examination without abnormal findings: Secondary | ICD-10-CM

## 2023-03-24 LAB — ALLERGENS W/COMP RFLX AREA 2
Alternaria Alternata IgE: <0.1 kU/L
Aspergillus Fumigatus IgE: <0.1 kU/L
Bermuda Grass IgE: <0.1 kU/L
Cedar, Mountain IgE: <0.1 kU/L
Cladosporium Herbarum IgE: <0.1 kU/L
Cockroach, German IgE: <0.1 kU/L
Common Silver Birch IgE: <0.1 kU/L
Cottonwood IgE: <0.1 kU/L
D Farinae IgE: 0.15 kU/L — AB
D Pteronyssinus IgE: 0.11 kU/L — AB
E001-IgE Cat Dander: <0.1 kU/L
E005-IgE Dog Dander: <0.1 kU/L
Elm, American IgE: <0.1 kU/L
IgE (Immunoglobulin E), Serum: 400 [IU]/mL (ref 6–495)
Johnson Grass IgE: <0.1 kU/L
Maple/Box Elder IgE: <0.1 kU/L
Mouse Urine IgE: <0.1 kU/L
Oak, White IgE: <0.1 kU/L
Pecan, Hickory IgE: <0.1 kU/L
Penicillium Chrysogen IgE: <0.1 kU/L
Pigweed, Rough IgE: <0.1 kU/L
Ragweed, Short IgE: <0.1 kU/L
Sheep Sorrel IgE Qn: <0.1 kU/L
Timothy Grass IgE: <0.1 kU/L
White Mulberry IgE: <0.1 kU/L

## 2023-03-24 LAB — TRYPTASE: Tryptase: 4.9 ug/L (ref 2.2–13.2)

## 2023-03-30 ENCOUNTER — Telehealth: Payer: Self-pay | Admitting: Family Medicine

## 2023-03-30 MED ORDER — AMOXICILLIN 400 MG/5ML PO SUSR: 400.0000 mg | Freq: Two times a day (BID) | ORAL | 0 refills | Status: DC

## 2023-03-30 NOTE — Telephone Encounter (Signed)
Patient called stating the prescription for her appointment on 10-31 for a drug food challenge was already Reconstituted when picked up so the medication will not last but 10 days so the patient needs another Prescription written for this appointment (Amoxicillin (amoxil 400 Mg/5ML Suspension)

## 2023-03-31 NOTE — Telephone Encounter (Signed)
Medication has been resent yesterday by someone else already with instructions for medication to be sent as powder. First prescription stated to reconstitute it when patient called to get prescription filled for the challenge. Which should have been the day before the challenge. Apparently the patient already picked up the medication as soon as it was sent into the pharmacy despite the instructions written.

## 2023-04-12 NOTE — Patient Instructions (Addendum)
In office oral penicillin challenge [name] was not able to tolerate the penicillin drug challenge today at the office.  Continue to monitor for allergic symptoms such as rash, wheezing, diarrhea, swelling, and vomiting for the next 24 hours. If severe symptoms occur call 911. For less severe symptoms treat with Benadryl 50 mg every 4 hours and call the clinic.  Continue to avoid medications containing penicillin at this time  Call the clinic if this treatment plan is not working well for you  Follow up in 3 months or sooner if needed.

## 2023-04-12 NOTE — Progress Notes (Signed)
[address] [place] [address] Dept: [phone]  FOLLOW UP NOTE  Patient ID: Bernell List, female    DOB: 02/21/1956  Age: 67 y.o. MRN: [id number] Date of Office Visit: 04/13/2023  Assessment  Chief Complaint: Food/Drug Challenge  HPI CAMARI QUINTANILLA is a 67 year old female who presents to the clinic for follow-up visit with possible penicillin challenge.  She was last seen in this clinic on 03/21/2023 by Dr. Dellis Anes as a new patient for evaluation of asthma, allergic rhinitis, and penicillin allergy.  Despite several antibiotic regimens she remains positive for H. pylori.  At today's visit, she reports that she is feeling well overall with no cardiopulmonary, integumentary, or gastrointestinal symptoms.  She reports that she has not had any antihistamines over the last 3 days.  She reports that she experienced vomiting after using penicillin about 8 years ago.  She reports that she has used amoxicillin about 1 year ago with no adverse reaction.  Her current medications are listed in the chart.  Drug Allergies:  Allergies  Allergen Reactions   Penicillins Nausea And Vomiting    tremors    Physical Exam: BP 122/66   Pulse 70   Temp 98.5 F (36.9 C)   Resp 16   Ht 5\' 3"  (1.6 m)   Wt 202 lb 4.8 oz (91.8 kg)   SpO2 98%   BMI 35.84 kg/m    Physical Exam Vitals reviewed.  Constitutional:      Appearance: Normal appearance.  Neurological:     Mental Status: She is alert.     Diagnostics: Percutaneous Penicillin Testing Control SPT: negative Histamine SPT: 3+ Pre-Pen SPT: negative Pen-G SPT: negative  Intradermal Penicillin Testing Control ID: negative  Pre-Pen ID: negative Pen-G (50 units/mL) ID: negative Pen-G (500 units/mL) ID: negative Pen-G (5000 units/mL) ID: negative  Testing was negative, therefore the patient will proceed with an oral penicillin challenge.  Procedure note:  Written consent obtained  Open graded penicillin oral challenge: The  patient was not able to tolerate the challenge today. She began to complain of a burning feeing in her chest 25 minutes after the 10% dose of penicillin (100 mg). She then reported shortness of breath and throat closing. She was treated with epinephrine 0.3 mg and cetirizine 20 mg. Vital signs and physical exam were within normal limits. She reported relief if symptoms within 10 minutes. She was monitored for 60 minutes after administration of epinephrine. Physical exam and vitals within normal limits upon discharge.  Total oral testing time: 135 minutes  Assessment and Plan: 1. Drug allergy     Patient Instructions  In office oral penicillin challenge Mayte Teola Bradley was not able to tolerate the penicillin drug challenge today at the office.  Continue to monitor for allergic symptoms such as rash, wheezing, diarrhea, swelling, and vomiting for the next 24 hours. If severe symptoms occur call 911. For less severe symptoms treat with Benadryl 50 mg every 4 hours and call the clinic.  Continue to avoid medications containing penicillin at this time  Call the clinic if this treatment plan is not working well for you  Follow up in 3 months or sooner if needed.   Return in about 2 weeks (around 04/27/2023), or if symptoms worsen or fail to improve.    Thank you for the opportunity to care for this patient.  Please do not hesitate to contact me with questions.  Thermon Leyland, FNP [facility]

## 2023-04-13 ENCOUNTER — Encounter: Payer: Self-pay | Admitting: Family Medicine

## 2023-04-13 ENCOUNTER — Ambulatory Visit (INDEPENDENT_AMBULATORY_CARE_PROVIDER_SITE_OTHER): Payer: 59 | Admitting: Family Medicine

## 2023-04-13 ENCOUNTER — Other Ambulatory Visit: Payer: Self-pay

## 2023-04-13 VITALS — BP 122/66 | HR 70 | Temp 98.5°F | Resp 16 | Ht 63.0 in | Wt 202.3 lb

## 2023-04-13 DIAGNOSIS — Z889 Allergy status to unspecified drugs, medicaments and biological substances status: Secondary | ICD-10-CM

## 2023-04-13 DIAGNOSIS — Z880 Allergy status to penicillin: Secondary | ICD-10-CM | POA: Diagnosis not present

## 2023-04-13 MED ORDER — EPINEPHRINE 0.3 MG/0.3ML IJ SOAJ: 0.3000 mg | Freq: Once | INTRAMUSCULAR | Status: DC

## 2023-04-13 MED ORDER — EPINEPHRINE (ANAPHYLAXIS) 1 MG/ML IJ SOLN
1.0000 mg | Freq: Once | INTRAMUSCULAR | Status: AC
  Administered 2023-04-13: 0.3 mg via INTRAMUSCULAR

## 2023-04-14 ENCOUNTER — Encounter: Payer: Self-pay | Admitting: Family Medicine

## 2023-05-01 ENCOUNTER — Ambulatory Visit
Admission: RE | Admit: 2023-05-01 | Discharge: 2023-05-01 | Disposition: A | Discharge status: Home / Self Care | Payer: 59 | Source: Ambulatory Visit | Attending: Internal Medicine | Admitting: Internal Medicine

## 2023-05-01 DIAGNOSIS — Z0000 Encounter for general adult medical examination without abnormal findings: Secondary | ICD-10-CM

## 2023-05-15 ENCOUNTER — Encounter: Payer: Self-pay | Admitting: Family Medicine

## 2023-05-15 ENCOUNTER — Other Ambulatory Visit: Payer: Self-pay

## 2023-05-15 ENCOUNTER — Ambulatory Visit (INDEPENDENT_AMBULATORY_CARE_PROVIDER_SITE_OTHER): Payer: 59 | Admitting: Family Medicine

## 2023-05-15 VITALS — BP 128/78 | HR 69 | Temp 97.7°F | Resp 14 | Wt 207.6 lb

## 2023-05-15 DIAGNOSIS — Z889 Allergy status to unspecified drugs, medicaments and biological substances status: Secondary | ICD-10-CM | POA: Insufficient documentation

## 2023-05-15 NOTE — Progress Notes (Signed)
[address] [place] [address] Dept: [phone]  FOLLOW UP NOTE  Patient ID: [name], female    DOB: [birth year]  Age: 67 y.o. MRN: [id number] Date of Office Visit: 05/15/2023  Assessment  Chief Complaint: Food/Drug Challenge (amoxicillin)  HPI [name] is a 67 year old female who presents to the clinic for follow-up visit.  She was last seen in this clinic on 04/13/2023 for penicillin/Pre-Pen skin testing which she passed without incident.  At her last visit, during penicillin oral challenge, she developed shortness of breath and throat closing which was treated with epinephrine with relief of symptoms.  Vital signs and physical exam remained within normal limits throughout that visit. She reports that she has taken amoxicillin several times and needs to be cleared for amoxicillin in order to treat H. pylori.  At today's visit, she reports that she is feeling well overall with no cardiopulmonary, gastrointestinal, or integumentary symptoms.  She has not had any antihistamines over the last 3 days.  Her current medications are listed in the chart.  Drug Allergies:  Allergies  Allergen Reactions   Penicillins Nausea And Vomiting    tremors    Physical Exam: BP 120/70   Pulse 76   Temp 97.7 F (36.5 C) (Temporal)   Resp 12   Wt 207 lb 9.6 oz (94.2 kg)   SpO2 99%   BMI 36.77 kg/m    Physical Exam Vitals reviewed.  Constitutional:      Appearance: Normal appearance.  HENT:     Head: Normocephalic and atraumatic.     Right Ear: Tympanic membrane normal.     Left Ear: Tympanic membrane normal.     Nose:     Comments: Bilateral naris normal.  Pharynx normal.  Ears normal.  Eyes normal.    Mouth/Throat:     Pharynx: Oropharynx is clear.  Cardiovascular:     Rate and Rhythm: Normal rate and regular rhythm.     Heart sounds: Normal heart sounds. No murmur heard. Pulmonary:     Effort: Pulmonary effort is normal.     Breath sounds: Normal breath sounds.      Comments: Lungs clear to auscultation Musculoskeletal:        General: Normal range of motion.     Cervical back: Normal range of motion and neck supple.  Skin:    General: Skin is warm and dry.  Neurological:     Mental Status: She is alert and oriented to person, place, and time.  Psychiatric:        Mood and Affect: Mood normal.        Behavior: Behavior normal.        Thought Content: Thought content normal.        Judgment: Judgment normal.    Procedure note:  Written consent obtained  Open graded amoxicillin oral challenge: The patient was able to tolerate the challenge today without adverse signs or symptoms. Vital signs were stable throughout the challenge and observation period. She received multiple doses separated by 30 minutes, each of which was separated by a brief physical exam. She received the following doses: 1%, 10% and 89% for a total of 1000 mg of amoxicillin. She was monitored for 60 minutes following the last dose.  Total testing time: 132 minutes  The patient had negative skin prick tests to penicillin and Pre-Pen  and was able to tolerate the open graded oral challenge today without adverse signs or symptoms. Therefore, she has the same  risk of systemic reaction associated with  the use of amoxicillin  as the general population.   Assessment and Plan: 1. Drug allergy     Patient Instructions  In office amoxicillin oral challenge [name] was able to tolerate the amoxicillin drug challenge today at the office without adverse signs or symptoms of an allergic reaction. Therefore, she has the same risk of systemic reaction associated with the consumption of amoxicillin as the general population.  - Do not give any amoxicillin for the next 24 hours. - Monitor for allergic symptoms such as rash, wheezing, diarrhea, swelling, and vomiting for the next 24 hours. If severe symptoms occur, treat with EpiPen injection and call 911. For less severe symptoms treat  with Benadryl 50 mg every 4 hours and call the clinic.   Call the clinic if this treatment plan is not working well for you  Follow up in 3 months or sooner if needed.  Return in about 3 months (around 08/13/2023), or if symptoms worsen or fail to improve.    Thank you for the opportunity to care for this patient.  Please do not hesitate to contact me with questions.  Thermon Leyland, FNP [facility]

## 2023-05-15 NOTE — Patient Instructions (Addendum)
In office amoxicillin oral challenge [name] was able to tolerate the amoxicillin drug challenge today at the office without adverse signs or symptoms of an allergic reaction. Therefore, she has the same risk of systemic reaction associated with the consumption of amoxicillin as the general population.  - Do not give any amoxicillin for the next 24 hours. - Monitor for allergic symptoms such as rash, wheezing, diarrhea, swelling, and vomiting for the next 24 hours. If severe symptoms occur, treat with EpiPen injection and call 911. For less severe symptoms treat with Benadryl 50 mg every 4 hours and call the clinic.   Call the clinic if this treatment plan is not working well for you  Follow up in 3 months or sooner if needed.

## 2023-07-12 ENCOUNTER — Telehealth: Payer: Self-pay | Admitting: Gastroenterology

## 2023-07-12 NOTE — Telephone Encounter (Signed)
Inbound call from patient requesting to speak with nurse, she states she is having issues with her stomach and is wondering if she needs to be tested for anything. Please advise.

## 2023-07-12 NOTE — Telephone Encounter (Signed)
Left message for patient to call back

## 2023-07-13 NOTE — Telephone Encounter (Signed)
Left message for patient to call back

## 2023-07-14 ENCOUNTER — Encounter: Payer: Self-pay | Admitting: *Deleted

## 2023-07-14 NOTE — Telephone Encounter (Signed)
Left additional message for patient to call back. We will await further correspondence from patient before reaching out further.

## 2023-07-14 NOTE — Telephone Encounter (Signed)
I have been in contact with [facility] (phone [phone]) to inquire about referral placed back in August 2024 for patient to have endoscopy with biopsies including culture and sensitivity to ascertain which antibiotics will eradicate H Pylori. Artist Pais says that they do have the referral and the referral status is "ready to be scheduled" although they have not contacted the patient to schedule.  I have gone forward with scheduling patient's procedure. This is scheduled for the first availability, Friday, 10/06/23, 2:30 pm arrival. Location for procedure is [facility] [address]. Patient is to enter through the main entrance, go to the basement floor and check in at the front desk. She will need to be NPO after midnight and will need a driver 18 years or older to accompany her. Artist Pais states that she will send prep instructions to patient's home address.  I have again left another message for patient to call back on the [phone] phone number.

## 2023-07-14 NOTE — Telephone Encounter (Addendum)
Patient called and stated that she has been trying to get a hold of the nurse for 2 days now. Patient was told that she needs to reschedule her 2 test due to her bacteria being very serious. Patient stated that if she does not hear from the nurse today or Monday then she will have to come to the office and figure out why no one is returning her call. Patient is requesting a call back. Please advise. A good call back number is [phone]

## 2023-07-17 NOTE — Telephone Encounter (Signed)
 Left additional voicemail for patient to call back

## 2023-07-18 NOTE — Telephone Encounter (Signed)
 Left message for patient to call back

## 2023-07-19 NOTE — Telephone Encounter (Signed)
 Left voicemail for patient to call back. This is my 6th attempt at reaching patient.   I will send a letter to patient's home address asking her to reach back out.

## 2023-10-05 ENCOUNTER — Telehealth: Payer: Self-pay | Admitting: Gastroenterology

## 2023-10-05 NOTE — Telephone Encounter (Signed)
 Returned patient call & she says she received a call from [facility] that her EGD was cancelled. She was unable to reach them for rescheduling & called our office to see if we could reschedule. Advised patient that if [facility] cancelled then they should be contacting her again & to give [facility] another call since her procedure is not something we can do here which is why referral was sent. Pt verbalized understanding.

## 2023-10-05 NOTE — Telephone Encounter (Signed)
 Inbound call from patient requesting a call to discuss surgery scheduled for tomorrow with [facility]. States she was told they may cancel appointment. Requesting a call back. Please advise, thank you.

## 2023-10-16 ENCOUNTER — Telehealth: Payer: Self-pay | Admitting: Gastroenterology

## 2023-10-16 DIAGNOSIS — B9681 Helicobacter pylori [H. pylori] as the cause of diseases classified elsewhere: Secondary | ICD-10-CM

## 2023-10-16 NOTE — Telephone Encounter (Signed)
 Inbound call from patient requesting a call to discuss recent results from [facility]. Also requesting to discuss next steps in plan of care. Please advise, thank you.

## 2023-10-17 NOTE — Telephone Encounter (Signed)
 10/10/23  EGD report and pathology results are in Care Everywhere for review. Please advise, Dr. Karene Oto is Hospital Doc this week. Thanks

## 2023-10-18 NOTE — Telephone Encounter (Signed)
 Called [facility] [phone]). I spoke with Loetta Ringer and was informed that H. Pylori sensitivity and culture is sent to [facility] clinic. Patient's specimen was sent on 10/10/23 for sensitivity and culture, result is still pending at this time.

## 2023-10-24 NOTE — Telephone Encounter (Signed)
 Patient requesting to speak with a nurse in regards to previous note. Please advise.   Thank you

## 2023-10-25 NOTE — Telephone Encounter (Signed)
 Left message for pt to call back

## 2023-10-26 NOTE — Telephone Encounter (Signed)
 Called patient & she was upset that our office has not contacted her regarding treatment. Advised her a nurse did reach out two days ago without an answer & apologized for any delay on our end, but we are waiting for results to finalize. She says she was told by [facility] that she had "a worm" and that they recommended she follow up with us  for treatment. She also started taking amoxicillin  for pneumonia from my understanding or an old prescription. Advised her our office would reach back out to [facility] for the results & be in touch with her once we've heard back.

## 2023-10-27 MED ORDER — CLARITHROMYCIN 500 MG PO TABS
500.0000 mg | ORAL_TABLET | Freq: Two times a day (BID) | ORAL | 0 refills | Status: AC
Start: 2023-10-27 — End: 2023-11-10

## 2023-10-27 MED ORDER — AMOXICILLIN 500 MG PO CAPS: 1000.0000 mg | ORAL_CAPSULE | Freq: Two times a day (BID) | ORAL | 0 refills | Status: AC

## 2023-10-27 MED ORDER — PANTOPRAZOLE SODIUM 40 MG PO TBEC: 40.0000 mg | DELAYED_RELEASE_TABLET | Freq: Two times a day (BID) | ORAL | 0 refills | Status: AC

## 2023-10-27 NOTE — Telephone Encounter (Signed)
 Received letter from [facility] stating patient needs 2-week course of twice daily PPI with amoxicillin  and clarithromycin. Will send in 2 weeks worth of antibiotics Emphasize need to complete the pack, if they have any issues let us  know.  Most common side effects include nausea, vomiting, diarrhea, stomach upset and rash.  Avoid alcohol use while on antibiotics.  Need to start Florastor probiotics twice a day.   Follow up 8 weeks for repeat H pylori stool testing to ensure eradication if off PPI or diatherix H pylori if he is on PPI

## 2023-10-27 NOTE — Telephone Encounter (Signed)
 Called and left patient a detailed vm informing her that [facility] will be able to provider her with pathology results and treatment for H. Pylori. I advised patient to contact [facility] for next steps. Advised patient to call back if she has any other questions.

## 2023-10-27 NOTE — Addendum Note (Signed)
 Addended by: Santina Cull on: 10/27/2023 03:56 PM   Modules accepted: Orders

## 2023-10-27 NOTE — Telephone Encounter (Signed)
 Able to see results shows sensitivity close erythromycin amoxicillin  resistance to Flagyl  tetracycline levo Floxin and rifampin. Per Dr. Karene Oto appears [facility] should be able to see these results and give medications that are appropriate. So please tell patient to be patient I will also send this to Dr. Karene Oto inbox so we can check on it on Monday to see if he has any further opinions.

## 2023-10-27 NOTE — Telephone Encounter (Signed)
 Called and spoke with patient regarding results and treatment plan. Pt knows to complete full course of antibiotics and PPI. Patient does not drink alcohol. Patient has been advised of common side effects. Patient knows that she will need H. Pylori stool test about 8 weeks after completing treatment. Pt has access to MyChart and is aware that this information has been sent to her there as well. Patient verbalized understanding of all information and had no concerns at the end of the call.

## 2023-10-27 NOTE — Telephone Encounter (Signed)
 H. Pylori culture and sensitivity results are available in Care Everywhere under "Labs". Thanks

## 2023-10-27 NOTE — Telephone Encounter (Signed)
Patient returning call to speak with a nurse.Please advise

## 2024-01-05 ENCOUNTER — Telehealth: Payer: Self-pay

## 2024-01-05 NOTE — Telephone Encounter (Signed)
MyChart message sent to patient with lab reminder.  

## 2024-01-05 NOTE — Telephone Encounter (Signed)
-----   Message from Nurse Carson B sent at 10/27/2023  4:29 PM EDT ----- Regarding: stool test H. Pylori stool antigen - order in epic - Craig

## 2024-01-11 ENCOUNTER — Other Ambulatory Visit: Payer: Self-pay

## 2024-01-22 ENCOUNTER — Other Ambulatory Visit

## 2024-01-22 DIAGNOSIS — K2970 Gastritis, unspecified, without bleeding: Secondary | ICD-10-CM

## 2024-01-22 DIAGNOSIS — B9681 Helicobacter pylori [H. pylori] as the cause of diseases classified elsewhere: Secondary | ICD-10-CM

## 2024-01-23 ENCOUNTER — Ambulatory Visit: Payer: Self-pay | Admitting: Physician Assistant

## 2024-01-23 LAB — HELICOBACTER PYLORI  SPECIAL ANTIGEN
MICRO NUMBER:: 16813579
SPECIMEN QUALITY: ADEQUATE

## 2024-03-08 ENCOUNTER — Other Ambulatory Visit: Payer: Self-pay | Admitting: Allergy & Immunology

## 2024-03-08 DIAGNOSIS — J3089 Other allergic rhinitis: Secondary | ICD-10-CM

## 2024-06-05 ENCOUNTER — Other Ambulatory Visit: Payer: Self-pay | Admitting: Allergy & Immunology

## 2024-06-05 DIAGNOSIS — J3089 Other allergic rhinitis: Secondary | ICD-10-CM
